# Patient Record
Sex: Female | Born: 1980 | Race: White | Hispanic: No | Marital: Single | Smoking: Former smoker
Health system: Southern US, Community
[De-identification: ages and names within clinical notes are randomized; demographics above are authoritative.]

## PROBLEM LIST (undated history)

## (undated) DIAGNOSIS — K219 Gastro-esophageal reflux disease without esophagitis: Secondary | ICD-10-CM

## (undated) DIAGNOSIS — R519 Headache, unspecified: Secondary | ICD-10-CM

---

## 2007-01-30 HISTORY — PX: DILATION AND CURETTAGE OF UTERUS: SHX78

## 2016-03-24 ENCOUNTER — Emergency Department (HOSPITAL_COMMUNITY)
Admission: EM | Admit: 2016-03-24 | Discharge: 2016-03-24 | Disposition: A | Discharge status: Home / Self Care | Payer: BLUE CROSS/BLUE SHIELD | Attending: Emergency Medicine | Admitting: Emergency Medicine

## 2016-03-24 ENCOUNTER — Emergency Department (HOSPITAL_COMMUNITY): Payer: BLUE CROSS/BLUE SHIELD

## 2016-03-24 ENCOUNTER — Encounter (HOSPITAL_COMMUNITY): Payer: Self-pay | Admitting: Emergency Medicine

## 2016-03-24 DIAGNOSIS — Z79899 Other long term (current) drug therapy: Secondary | ICD-10-CM | POA: Diagnosis not present

## 2016-03-24 DIAGNOSIS — F17200 Nicotine dependence, unspecified, uncomplicated: Secondary | ICD-10-CM | POA: Diagnosis not present

## 2016-03-24 DIAGNOSIS — K920 Hematemesis: Secondary | ICD-10-CM

## 2016-03-24 DIAGNOSIS — D72829 Elevated white blood cell count, unspecified: Secondary | ICD-10-CM | POA: Insufficient documentation

## 2016-03-24 DIAGNOSIS — R112 Nausea with vomiting, unspecified: Secondary | ICD-10-CM

## 2016-03-24 DIAGNOSIS — E860 Dehydration: Secondary | ICD-10-CM | POA: Insufficient documentation

## 2016-03-24 LAB — COMPREHENSIVE METABOLIC PANEL
ALT: 21 U/L (ref 14–54)
ANION GAP: 16 — AB (ref 5–15)
AST: 30 U/L (ref 15–41)
Albumin: 4.5 g/dL (ref 3.5–5.0)
Alkaline Phosphatase: 62 U/L (ref 38–126)
BUN: 10 mg/dL (ref 6–20)
CHLORIDE: 100 mmol/L — AB (ref 101–111)
CO2: 20 mmol/L — AB (ref 22–32)
Calcium: 9.4 mg/dL (ref 8.9–10.3)
Creatinine, Ser: 1.18 mg/dL — ABNORMAL HIGH (ref 0.44–1.00)
GFR, EST NON AFRICAN AMERICAN: 59 mL/min — AB (ref >60)
Glucose, Bld: 171 mg/dL — ABNORMAL HIGH (ref 65–99)
POTASSIUM: 3.7 mmol/L (ref 3.5–5.1)
SODIUM: 136 mmol/L (ref 135–145)
Total Bilirubin: 1.5 mg/dL — ABNORMAL HIGH (ref 0.3–1.2)
Total Protein: 8.4 g/dL — ABNORMAL HIGH (ref 6.5–8.1)

## 2016-03-24 LAB — CBC WITH DIFFERENTIAL/PLATELET
Basophils Absolute: 0 10*3/uL (ref 0.0–0.1)
Basophils Relative: 0 %
EOS ABS: 0 10*3/uL (ref 0.0–0.7)
Eosinophils Relative: 0 %
HEMATOCRIT: 40.3 % (ref 36.0–46.0)
HEMOGLOBIN: 14.5 g/dL (ref 12.0–15.0)
LYMPHS ABS: 2.8 10*3/uL (ref 0.7–4.0)
LYMPHS PCT: 11 %
MCH: 32.8 pg (ref 26.0–34.0)
MCHC: 36 g/dL (ref 30.0–36.0)
MCV: 91.2 fL (ref 78.0–100.0)
MONOS PCT: 5 %
Monocytes Absolute: 1.4 10*3/uL — ABNORMAL HIGH (ref 0.1–1.0)
NEUTROS ABS: 20.8 10*3/uL — AB (ref 1.7–7.7)
NEUTROS PCT: 84 %
Platelets: 312 10*3/uL (ref 150–400)
RBC: 4.42 MIL/uL (ref 3.87–5.11)
RDW: 12.2 % (ref 11.5–15.5)
WBC: 24.9 10*3/uL — AB (ref 4.0–10.5)

## 2016-03-24 LAB — I-STAT BETA HCG BLOOD, ED (MC, WL, AP ONLY): I-stat hCG, quantitative: <5 m[IU]/mL (ref <5)

## 2016-03-24 LAB — LIPASE, BLOOD: LIPASE: 16 U/L (ref 11–51)

## 2016-03-24 MED ORDER — PROMETHAZINE HCL 25 MG RE SUPP: 25.0000 mg | Freq: Four times a day (QID) | RECTAL | 0 refills | Status: DC | PRN

## 2016-03-24 MED ORDER — GI COCKTAIL ~~LOC~~
30.0000 mL | Freq: Once | ORAL | Status: AC
  Administered 2016-03-24: 30 mL via ORAL
  Filled 2016-03-24: qty 30

## 2016-03-24 MED ORDER — PROMETHAZINE HCL 25 MG/ML IJ SOLN
25.0000 mg | Freq: Once | INTRAMUSCULAR | Status: AC
  Administered 2016-03-24: 25 mg via INTRAVENOUS
  Filled 2016-03-24: qty 1

## 2016-03-24 MED ORDER — KETOROLAC TROMETHAMINE 30 MG/ML IJ SOLN
30.0000 mg | Freq: Once | INTRAMUSCULAR | Status: AC
  Administered 2016-03-24: 30 mg via INTRAVENOUS
  Filled 2016-03-24: qty 1

## 2016-03-24 MED ORDER — PROMETHAZINE HCL 25 MG PO TABS: 25.0000 mg | ORAL_TABLET | Freq: Four times a day (QID) | ORAL | 0 refills | Status: DC | PRN

## 2016-03-24 MED ORDER — SODIUM CHLORIDE 0.9 % IV BOLUS (SEPSIS)
1000.0000 mL | Freq: Once | INTRAVENOUS | Status: AC
  Administered 2016-03-24: 1000 mL via INTRAVENOUS

## 2016-03-24 MED ORDER — MORPHINE SULFATE (PF) 4 MG/ML IV SOLN
4.0000 mg | Freq: Once | INTRAVENOUS | Status: AC
  Administered 2016-03-24: 4 mg via INTRAVENOUS
  Filled 2016-03-24: qty 1

## 2016-03-24 MED ORDER — ONDANSETRON HCL 4 MG/2ML IJ SOLN
4.0000 mg | Freq: Once | INTRAMUSCULAR | Status: AC
  Administered 2016-03-24: 4 mg via INTRAVENOUS
  Filled 2016-03-24: qty 2

## 2016-03-24 MED ORDER — LIDOCAINE VISCOUS 2 % MT SOLN: 20.0000 mL | OROMUCOSAL | 0 refills | Status: DC | PRN

## 2016-03-24 MED ORDER — PANTOPRAZOLE SODIUM 40 MG IV SOLR
40.0000 mg | Freq: Once | INTRAVENOUS | Status: AC
  Administered 2016-03-24: 40 mg via INTRAVENOUS
  Filled 2016-03-24: qty 40
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
## 2016-03-24 NOTE — ED Provider Notes (Signed)
[facility]-EMERGENCY DEPT Provider Note   CSN: [id number] Arrival date & time: 03/24/16  1135     History   Chief Complaint Chief Complaint  Patient presents with  . Emesis    HPI Joanna Massey is a 36 y.o. female.  HPI   Pt p/w uncontrolled nausea and vomiting x 3 days, now with throat irritation and bloody streaks in the emesis.  Vomiting TNTC, diarrhea only 3 times.  Notes initially had epigastric pain that is now just mild soreness.  Denies urinary or vaginal symptoms.  Denies any sick contacts, abnormal foods, recent travel.  No hx abdominal surgeries.  Went to [facility] yesterday and was given zofran without improvement.     History reviewed. No pertinent past medical history.  There are no active problems to display for this patient.   History reviewed. No pertinent surgical history.  OB History    No data available       Home Medications    Prior to Admission medications   Medication Sig Start Date End Date Taking? Authorizing Provider  aspirin-acetaminophen-caffeine (EXCEDRIN MIGRAINE) [phone] MG tablet Take 1 tablet by mouth every 6 (six) hours as needed for headache.   Yes Historical Provider, MD  lidocaine (XYLOCAINE) 2 % solution Use as directed 20 mLs in the mouth or throat as needed for mouth pain. 03/24/16   Trixie Dredge, PA-C  promethazine (PHENERGAN) 25 MG suppository Place 1 suppository (25 mg total) rectally every 6 (six) hours as needed for nausea, vomiting or refractory nausea / vomiting. 03/24/16   Trixie Dredge, PA-C  promethazine (PHENERGAN) 25 MG tablet Take 1 tablet (25 mg total) by mouth every 6 (six) hours as needed for nausea or vomiting. 03/24/16   Trixie Dredge, PA-C    Family History No family history on file.  Social History Social History  Substance Use Topics  . Smoking status: Current Every Day Smoker  . Smokeless tobacco: Not on file  . Alcohol use No     Allergies   Patient has no known allergies.   Review of Systems Review of  Systems  All other systems reviewed and are negative.    Physical Exam Updated Vital Signs BP 119/77 (BP Location: Right Arm)   Pulse 68   Temp 99.2 F (37.3 C) (Oral)   Resp 18   LMP 03/03/2016   SpO2 100%   Physical Exam  Constitutional: She appears well-developed and well-nourished. No distress.  HENT:  Head: Normocephalic and atraumatic.  Neck: Normal range of motion. Neck supple.  Erythematous irritation of the posterior pharynx  Cardiovascular: Normal rate and regular rhythm.   Pulmonary/Chest: Effort normal and breath sounds normal. No stridor. No respiratory distress. She has no wheezes. She has no rales.  Abdominal: Soft. She exhibits no distension. There is no tenderness. There is no rebound and no guarding.  Lymphadenopathy:    She has no cervical adenopathy.  Neurological: She is alert.  Skin: She is not diaphoretic.  Nursing note and vitals reviewed.    ED Treatments / Results  Labs (all labs ordered are listed, but only abnormal results are displayed) Labs Reviewed  COMPREHENSIVE METABOLIC PANEL - Abnormal; Notable for the following:       Result Value   Chloride 100 (*)    CO2 20 (*)    Glucose, Bld 171 (*)    Creatinine, Ser 1.18 (*)    Total Protein 8.4 (*)    Total Bilirubin 1.5 (*)    GFR calc non Af  Amer 59 (*)    Anion gap 16 (*)    All other components within normal limits  CBC WITH DIFFERENTIAL/PLATELET - Abnormal; Notable for the following:    WBC 24.9 (*)    Neutro Abs 20.8 (*)    Monocytes Absolute 1.4 (*)    All other components within normal limits  LIPASE, BLOOD  I-STAT BETA HCG BLOOD, ED ([facility], [facility], [facility] ONLY)    EKG  EKG Interpretation None       Radiology Dg Neck Soft Tissue  Result Date: 03/24/2016 CLINICAL DATA:  Sore throat and hematemesis. EXAM: NECK SOFT TISSUES - 1+ VIEW COMPARISON:  None. FINDINGS: There is no evidence of retropharyngeal soft tissue swelling or epiglottic enlargement. The cervical airway is  unremarkable and no radio-opaque foreign body identified. There is mild nonspecific prominence of the prevertebral soft tissues at and below the C3-4 level. IMPRESSION: Mild nonspecific soft tissue prominence of the prevertebral soft tissues at and below the C3-4 level. Patent airway. Electronically Signed   By: Elberta Fortisaniel  Boyle M.D.   On: 03/24/2016 14:02   Dg Chest 2 View  Result Date: 03/24/2016 CLINICAL DATA:  Cough and hemoptysis for 2 days. EXAM: CHEST  2 VIEW COMPARISON:  None. FINDINGS: The heart size and mediastinal contours are within normal limits. Both lungs are clear. The visualized skeletal structures are unremarkable. IMPRESSION: No active cardiopulmonary disease. Electronically Signed   By: Myles RosenthalJohn  Stahl M.D.   On: 03/24/2016 13:36    Procedures Procedures (including critical care time)  Medications Ordered in ED Medications  sodium chloride 0.9 % bolus 1,000 mL (0 mLs Intravenous Stopped 03/24/16 1408)  promethazine (PHENERGAN) injection 25 mg (25 mg Intravenous Given 03/24/16 1240)  gi cocktail (Maalox,Lidocaine,Donnatal) (30 mLs Oral Given 03/24/16 1156)  ketorolac (TORADOL) 30 MG/ML injection 30 mg (30 mg Intravenous Given 03/24/16 1156)  ondansetron (ZOFRAN) injection 4 mg (4 mg Intravenous Given 03/24/16 1156)  pantoprazole (PROTONIX) injection 40 mg (40 mg Intravenous Given 03/24/16 1156)  morphine 4 MG/ML injection 4 mg (4 mg Intravenous Given 03/24/16 1327)  ondansetron (ZOFRAN) injection 4 mg (4 mg Intravenous Given 03/24/16 1458)     Initial Impression / Assessment and Plan / ED Course  I have reviewed the triage vital signs and the nursing notes.  Pertinent labs & imaging results that were available during my care of the patient were reviewed by me and considered in my medical decision making (see chart for details).  Clinical Course as of Mar 24 1537  Sat Mar 24, 2016  1536 Repeat abdominal completely benign.  No tenderness.  Pt tolerating PO.  Discussed strict return  precautions.    [EW]    Clinical Course User Index [EW] Trixie DredgeEmily Courtni Balash, PA-C    Afebrile, nontoxic patient with several days of N/V.  Last night develop throat pain and blood-streak emesis.  Likely mallory-weiss tear.  Vomiting controlled.   Some renal insufficiency, likely from dehydration.  IVF given.  Pt tolerating PO.  No abdominal pain or tenderness.  Xrays of chest and neck negative.  Labs significant also for leukocytosis.  No e/o bacterial infection apparent from my exam.  Also seen and examined by Dr Particia NearingHaviland.   D/C home with phenergan (PO and PR, discussed with patient), viscous lidocaine for throat pain, PCP follow up for repeat labs and recheck.  Discussed result, findings, treatment, and follow up  with patient.  Pt given return precautions.  Pt verbalizes understanding and agrees with plan.  Final Clinical Impressions(s) / ED Diagnoses   Final diagnoses:  Non-intractable vomiting with nausea, unspecified vomiting type  Hematemesis with nausea  Leukocytosis, unspecified type  Dehydration    New Prescriptions New Prescriptions   LIDOCAINE (XYLOCAINE) 2 % SOLUTION    Use as directed 20 mLs in the mouth or throat as needed for mouth pain.   PROMETHAZINE (PHENERGAN) 25 MG SUPPOSITORY    Place 1 suppository (25 mg total) rectally every 6 (six) hours as needed for nausea, vomiting or refractory nausea / vomiting.   PROMETHAZINE (PHENERGAN) 25 MG TABLET    Take 1 tablet (25 mg total) by mouth every 6 (six) hours as needed for nausea or vomiting.     Trixie Dredge, PA-C 03/24/16 1539    Jacalyn Lefevre, MD 03/25/16 [phone]

## 2016-03-24 NOTE — Discharge Instructions (Signed)
Read the information below.  Use the prescribed medication as directed.  Please discuss all new medications with your pharmacist.  You may return to the Emergency Department at any time for worsening condition or any new symptoms that concern you.   If you develop fevers, uncontrolled pain, uncontrolled vomiting, or difficulty breathing or swallowing return to the ER for a recheck.

## 2016-03-24 NOTE — ED Triage Notes (Signed)
Per pt, states she has been vomiting since last Thursday-states she went to [facility] yesterday and was given Zofran-states now hands are numb

## 2016-03-24 NOTE — ED Notes (Signed)
Pt denies nausea and vomiting after PO challenge.

## 2016-03-25 ENCOUNTER — Encounter (HOSPITAL_COMMUNITY): Payer: Self-pay

## 2016-03-25 ENCOUNTER — Emergency Department (HOSPITAL_COMMUNITY): Payer: BLUE CROSS/BLUE SHIELD

## 2016-03-25 ENCOUNTER — Emergency Department (HOSPITAL_COMMUNITY)
Admission: EM | Admit: 2016-03-25 | Discharge: 2016-03-25 | Disposition: A | Discharge status: Home / Self Care | Payer: BLUE CROSS/BLUE SHIELD | Attending: Emergency Medicine | Admitting: Emergency Medicine

## 2016-03-25 DIAGNOSIS — F17200 Nicotine dependence, unspecified, uncomplicated: Secondary | ICD-10-CM | POA: Insufficient documentation

## 2016-03-25 DIAGNOSIS — Z7982 Long term (current) use of aspirin: Secondary | ICD-10-CM | POA: Insufficient documentation

## 2016-03-25 DIAGNOSIS — R918 Other nonspecific abnormal finding of lung field: Secondary | ICD-10-CM | POA: Diagnosis not present

## 2016-03-25 DIAGNOSIS — J029 Acute pharyngitis, unspecified: Secondary | ICD-10-CM | POA: Diagnosis present

## 2016-03-25 DIAGNOSIS — Z79899 Other long term (current) drug therapy: Secondary | ICD-10-CM | POA: Diagnosis not present

## 2016-03-25 DIAGNOSIS — R112 Nausea with vomiting, unspecified: Secondary | ICD-10-CM | POA: Diagnosis not present

## 2016-03-25 LAB — CBC WITH DIFFERENTIAL/PLATELET
Basophils Absolute: 0 10*3/uL (ref 0.0–0.1)
Basophils Relative: 0 %
EOS PCT: 0 %
Eosinophils Absolute: 0 10*3/uL (ref 0.0–0.7)
HEMATOCRIT: 39.6 % (ref 36.0–46.0)
HEMOGLOBIN: 13.8 g/dL (ref 12.0–15.0)
LYMPHS ABS: 2.8 10*3/uL (ref 0.7–4.0)
LYMPHS PCT: 22 %
MCH: 32.6 pg (ref 26.0–34.0)
MCHC: 34.8 g/dL (ref 30.0–36.0)
MCV: 93.6 fL (ref 78.0–100.0)
Monocytes Absolute: 1.1 10*3/uL — ABNORMAL HIGH (ref 0.1–1.0)
Monocytes Relative: 9 %
Neutro Abs: 8.7 10*3/uL — ABNORMAL HIGH (ref 1.7–7.7)
Neutrophils Relative %: 69 %
Platelets: 236 10*3/uL (ref 150–400)
RBC: 4.23 MIL/uL (ref 3.87–5.11)
RDW: 12.6 % (ref 11.5–15.5)
WBC: 12.5 10*3/uL — AB (ref 4.0–10.5)

## 2016-03-25 LAB — LIPASE, BLOOD: LIPASE: 17 U/L (ref 11–51)

## 2016-03-25 LAB — RAPID STREP SCREEN (MED CTR MEBANE ONLY): STREPTOCOCCUS, GROUP A SCREEN (DIRECT): NEGATIVE

## 2016-03-25 MED ORDER — IOPAMIDOL (ISOVUE-300) INJECTION 61%
75.0000 mL | Freq: Once | INTRAVENOUS | Status: AC | PRN
  Administered 2016-03-25: 75 mL via INTRAVENOUS

## 2016-03-25 MED ORDER — SODIUM CHLORIDE 0.9 % IV BOLUS (SEPSIS)
1000.0000 mL | Freq: Once | INTRAVENOUS | Status: AC
  Administered 2016-03-25: 1000 mL via INTRAVENOUS

## 2016-03-25 MED ORDER — HYDROCODONE-ACETAMINOPHEN 7.5-325 MG/15ML PO SOLN: 10.0000 mL | Freq: Four times a day (QID) | ORAL | 0 refills | Status: DC | PRN

## 2016-03-25 MED ORDER — PANTOPRAZOLE SODIUM 40 MG IV SOLR
40.0000 mg | Freq: Once | INTRAVENOUS | Status: AC
  Administered 2016-03-25: 40 mg via INTRAVENOUS
  Filled 2016-03-25: qty 40

## 2016-03-25 MED ORDER — PENICILLIN G BENZATHINE 1200000 UNIT/2ML IM SUSP
1.2000 10*6.[IU] | Freq: Once | INTRAMUSCULAR | Status: AC
  Administered 2016-03-25: 1.2 10*6.[IU] via INTRAMUSCULAR
  Filled 2016-03-25: qty 2

## 2016-03-25 MED ORDER — POTASSIUM CHLORIDE 10 MEQ/100ML IV SOLN
10.0000 meq | INTRAVENOUS | Status: AC
  Administered 2016-03-25 (×3): 10 meq via INTRAVENOUS
  Filled 2016-03-25 (×3): qty 100

## 2016-03-25 MED ORDER — PENICILLIN G BENZATHINE & PROC 1200000 UNIT/2ML IM SUSP
1.2000 10*6.[IU] | Freq: Once | INTRAMUSCULAR | Status: DC
  Filled 2016-03-25: qty 2

## 2016-03-25 MED ORDER — MORPHINE SULFATE (PF) 4 MG/ML IV SOLN
4.0000 mg | Freq: Once | INTRAVENOUS | Status: AC
  Administered 2016-03-25: 4 mg via INTRAVENOUS
  Filled 2016-03-25: qty 1

## 2016-03-25 MED ORDER — IOPAMIDOL (ISOVUE-300) INJECTION 61%
INTRAVENOUS | Status: AC
  Filled 2016-03-25: qty 75

## 2016-03-25 MED ORDER — METOCLOPRAMIDE HCL 5 MG/ML IJ SOLN
10.0000 mg | Freq: Once | INTRAMUSCULAR | Status: AC
  Administered 2016-03-25: 10 mg via INTRAVENOUS
  Filled 2016-03-25: qty 2

## 2016-03-25 MED ORDER — HYDROMORPHONE HCL 1 MG/ML IJ SOLN
1.0000 mg | Freq: Once | INTRAMUSCULAR | Status: AC
  Administered 2016-03-25: 1 mg via INTRAVENOUS
  Filled 2016-03-25: qty 1

## 2016-03-25 MED ORDER — SODIUM CHLORIDE 0.9 % IV SOLN
30.0000 meq | Freq: Once | INTRAVENOUS | Status: DC
  Filled 2016-03-25: qty 15

## 2016-03-25 NOTE — Discharge Instructions (Signed)
Read the information below.  Use the prescribed medication as directed.  Please discuss all new medications with your pharmacist.  You may return to the Emergency Department at any time for worsening condition or any new symptoms that concern you.   If you develop high fevers, difficulty swallowing or breathing, or you are unable to tolerate fluids by mouth, return to the ER immediately for a recheck.    °

## 2016-03-25 NOTE — ED Notes (Signed)
Pt ambulatory and independent at discharge.  Verbalized understanding of discharge instructions 

## 2016-03-25 NOTE — ED Triage Notes (Signed)
Pt here yesterday for vomiting.  Pt told to return if continues.  States even with phenergan suppository she continues to have emesis.  Some blood noted.  No abdominal pain

## 2016-03-25 NOTE — ED Notes (Signed)
Patient transported to CT 

## 2016-03-25 NOTE — ED Notes (Signed)
Waiting for KCl from pharmacy. Pt waiting for CT.

## 2016-03-25 NOTE — ED Provider Notes (Signed)
[facility]-EMERGENCY DEPT Provider Note   CSN: [id number] Arrival date & time: 03/25/16  4098     History   Chief Complaint Chief Complaint  Patient presents with  . Emesis  . Sore Throat    HPI Joanna Massey is a 36 y.o. female.  HPI   Pt seen in the ED by me yesterday returns for continued symptoms.  Was seen yesterday for 3 days of N/V/D with more vomiting than diarrhea with abrupt throat pain and blood-tinged emesis.  She is also coughing.  Symptoms improved in ED though concerning leukocytosis, suspected dx mallory weiss tear.  Returns today for continued pain in her throat and N/V, also having more cough that feels more productive.  Does have some pain in her chest.  Has had a few episodes of diarrhea.  Denies SOB, abdominal pain.     History reviewed. No pertinent past medical history.  There are no active problems to display for this patient.   History reviewed. No pertinent surgical history.  OB History    No data available       Home Medications    Prior to Admission medications   Medication Sig Start Date End Date Taking? Authorizing Provider  aspirin-acetaminophen-caffeine (EXCEDRIN MIGRAINE) [phone] MG tablet Take 1 tablet by mouth every 6 (six) hours as needed for headache.   Yes Historical Provider, MD  lidocaine (XYLOCAINE) 2 % solution Use as directed 20 mLs in the mouth or throat as needed for mouth pain. 03/24/16  Yes Trixie Dredge, PA-C  promethazine (PHENERGAN) 25 MG suppository Place 1 suppository (25 mg total) rectally every 6 (six) hours as needed for nausea, vomiting or refractory nausea / vomiting. 03/24/16  Yes Trixie Dredge, PA-C  promethazine (PHENERGAN) 25 MG tablet Take 1 tablet (25 mg total) by mouth every 6 (six) hours as needed for nausea or vomiting. 03/24/16  Yes Trixie Dredge, PA-C  HYDROcodone-acetaminophen (HYCET) 7.5-325 mg/15 ml solution Take 10 mLs by mouth 4 (four) times daily as needed for moderate pain or severe pain. 03/25/16   Trixie Dredge,  PA-C    Family History History reviewed. No pertinent family history.  Social History Social History  Substance Use Topics  . Smoking status: Current Every Day Smoker  . Smokeless tobacco: Never Used  . Alcohol use No     Allergies   Patient has no known allergies.   Review of Systems Review of Systems  All other systems reviewed and are negative.    Physical Exam Updated Vital Signs BP 134/85 (BP Location: Left Arm)   Pulse (!) 58   Temp 98.4 F (36.9 C) (Oral)   Resp 16   Ht 5' 3.5" (1.613 m)   Wt 110.2 kg   LMP 03/03/2016   SpO2 98%   BMI 42.37 kg/m   Physical Exam  Constitutional: She appears well-developed and well-nourished. No distress.  HENT:  Head: Normocephalic and atraumatic.  Mouth/Throat: Uvula is midline. Mucous membranes are not dry. Oropharyngeal exudate, posterior oropharyngeal edema and posterior oropharyngeal erythema present. No tonsillar abscesses.  Neck: Normal range of motion. Neck supple.  Cardiovascular: Normal rate and regular rhythm.   Pulmonary/Chest: Effort normal and breath sounds normal. No respiratory distress. She has no wheezes. She has no rales.  Abdominal: Soft. She exhibits no distension. There is no tenderness. There is no rebound and no guarding.  Lymphadenopathy:    She has no cervical adenopathy.  Neurological: She is alert.  Skin: She is not diaphoretic.  Nursing note and vitals  reviewed.    ED Treatments / Results  Labs (all labs ordered are listed, but only abnormal results are displayed) Labs Reviewed  CBC WITH DIFFERENTIAL/PLATELET - Abnormal; Notable for the following:       Result Value   WBC 12.5 (*)    Neutro Abs 8.7 (*)    Monocytes Absolute 1.1 (*)    All other components within normal limits  COMPREHENSIVE METABOLIC PANEL - Abnormal; Notable for the following:    Potassium 2.7 (*)    Glucose, Bld 101 (*)    Creatinine, Ser 1.09 (*)    All other components within normal limits  RAPID STREP  SCREEN (NOT [facility])  CULTURE, GROUP A STREP ([facility])  LIPASE, BLOOD    EKG  EKG Interpretation None       Radiology Dg Neck Soft Tissue  Result Date: 03/24/2016 CLINICAL DATA:  Sore throat and hematemesis. EXAM: NECK SOFT TISSUES - 1+ VIEW COMPARISON:  None. FINDINGS: There is no evidence of retropharyngeal soft tissue swelling or epiglottic enlargement. The cervical airway is unremarkable and no radio-opaque foreign body identified. There is mild nonspecific prominence of the prevertebral soft tissues at and below the C3-4 level. IMPRESSION: Mild nonspecific soft tissue prominence of the prevertebral soft tissues at and below the C3-4 level. Patent airway. Electronically Signed   By: Elberta Fortisaniel  Boyle M.D.   On: 03/24/2016 14:02   Dg Chest 2 View  Result Date: 03/24/2016 CLINICAL DATA:  Cough and hemoptysis for 2 days. EXAM: CHEST  2 VIEW COMPARISON:  None. FINDINGS: The heart size and mediastinal contours are within normal limits. Both lungs are clear. The visualized skeletal structures are unremarkable. IMPRESSION: No active cardiopulmonary disease. Electronically Signed   By: Myles RosenthalJohn  Stahl M.D.   On: 03/24/2016 13:36   Ct Soft Tissue Neck W Contrast  Result Date: 03/25/2016 CLINICAL DATA:  Persistent emesis.  Hemoptysis.  Sore throat. EXAM: CT NECK WITH CONTRAST TECHNIQUE: Multidetector CT imaging of the neck was performed using the standard protocol following the bolus administration of intravenous contrast. CONTRAST:  75 mL Isovue 300 COMPARISON:  Soft tissue neck radiographs 03/24/2016 FINDINGS: Pharynx and larynx: The palate tonsils are enlarged bilaterally. Adenoid tissue is prominent. Lingual tonsils are enlarged as well. There is no focal mass lesion. The parapharyngeal fat is clear. The airway is patent. The hypopharynx is clear. Vocal cords are midline and symmetric. Salivary glands: Mild inflammatory changes are seen about bilaterally. There is no obstruction of the submandibular duct.  Parotid glands are within normal limits. Thyroid: Normal. Lymph nodes: Enlarged level 2 and 3 lymph nodes bilaterally are likely reactive. Subcentimeter level 1 lymph nodes are present as well. Vascular: Minimal atherosclerotic calcifications are stenosis. Limited intracranial: Within normal limits. Visualized orbits: Unremarkable Mastoids and visualized paranasal sinuses: Polyps or mucous retention cysts are present in the left maxillary sinus. There is some mucosal thickening and posterior right ethmoid air cells. The remaining paranasal sinuses and mastoid air cells are clear. Skeleton: Vertebral body heights and alignment are maintained. No focal lytic or blastic lesions are present. Upper chest: The lung apices are clear. 1. Diffuse enlarged of palatine tonsils, adenoid tissue common lingual tonsils compatible with acute pharyngitis. 2. No discrete mass or abscess formation. 3. Bilateral cervical adenopathy is likely reactive. Electronically Signed   By: Marin Robertshristopher  Mattern M.D.   On: 03/25/2016 11:45   Ct Chest W Contrast  Result Date: 03/25/2016 CLINICAL DATA:  Continuing emesis. Seen here yesterday for the same. Hemoptysis reported, sore  throat. EXAM: CT CHEST WITH CONTRAST TECHNIQUE: Multidetector CT imaging of the chest was performed during intravenous contrast administration. CONTRAST:  75mL ISOVUE-300 IOPAMIDOL (ISOVUE-300) INJECTION 61% COMPARISON:  Chest radiograph, 03/24/2016 FINDINGS: Cardiovascular: No significant vascular findings. Normal heart size. No pericardial effusion. The great vessels are normal in caliber. No aortic atherosclerosis. No coronary artery calcifications. Mediastinum/Nodes: No enlarged mediastinal, hilar, or axillary lymph nodes. Thyroid gland, trachea, and esophagus demonstrate no significant findings. Lungs/Pleura: Lungs are clear. No pleural effusion or pneumothorax. Upper Abdomen: No acute findings.  Gallstones. Musculoskeletal: No chest wall abnormality. No acute or  significant osseous findings. IMPRESSION: 1. No acute findings. No findings to account for hemoptysis. Lungs are clear. 2. Gallstones. Electronically Signed   By: Amie Portland M.D.   On: 03/25/2016 11:51    Procedures Procedures (including critical care time)  Medications Ordered in ED Medications  potassium chloride 10 mEq in 100 mL IVPB (10 mEq Intravenous New Bag/Given 03/25/16 1455)  iopamidol (ISOVUE-300) 61 % injection (not administered)  sodium chloride 0.9 % bolus 1,000 mL (0 mLs Intravenous Stopped 03/25/16 1035)  metoCLOPramide (REGLAN) injection 10 mg (10 mg Intravenous Given 03/25/16 0937)  pantoprazole (PROTONIX) injection 40 mg (40 mg Intravenous Given 03/25/16 0941)  morphine 4 MG/ML injection 4 mg (4 mg Intravenous Given 03/25/16 0938)  iopamidol (ISOVUE-300) 61 % injection 75 mL (75 mLs Intravenous Contrast Given 03/25/16 1108)  metoCLOPramide (REGLAN) injection 10 mg (10 mg Intravenous Given 03/25/16 1247)  sodium chloride 0.9 % bolus 1,000 mL (0 mLs Intravenous Stopped 03/25/16 1456)  HYDROmorphone (DILAUDID) injection 1 mg (1 mg Intravenous Given 03/25/16 1247)  penicillin g benzathine (BICILLIN LA) 1200000 UNIT/2ML injection 1.2 Million Units (1.2 Million Units Intramuscular Given 03/25/16 1456)     Initial Impression / Assessment and Plan / ED Course  I have reviewed the triage vital signs and the nursing notes.  Pertinent labs & imaging results that were available during my care of the patient were reviewed by me and considered in my medical decision making (see chart for details).  Clinical Course as of Mar 25 1530  Wynelle Link Mar 25, 2016  1445 Pt tolerating PO.   [EW]    Clinical Course User Index [EW] Trixie Dredge, PA-C    Afebrile, nontoxic patient with N/V, sore throat.  Leukocytosis has improved today.  Now hypokalemic, repleted in the ED.  IVF, symptomatic medications given . CT neck and chest reveals only pharyngitis with associated lymphadenopathy.  Strep screen is  negative but given significant symptoms and appearance of pharynx will treat.  IM penicillin given.  Pt tolerating PO including apple sauce prior to discharge.   D/C home with lortab elixir.  Pt previously given phenergan PO and PR.  Discussed result, findings, treatment, and follow up  with patient.  Pt given return precautions.  Pt verbalizes understanding and agrees with plan.       Final Clinical Impressions(s) / ED Diagnoses   Final diagnoses:  Pharyngitis, unspecified etiology  Nausea and vomiting, intractability of vomiting not specified, unspecified vomiting type    New Prescriptions New Prescriptions   HYDROCODONE-ACETAMINOPHEN (HYCET) 7.5-325 MG/15 ML SOLUTION    Take 10 mLs by mouth 4 (four) times daily as needed for moderate pain or severe pain.     Trixie Dredge, PA-C 03/25/16 1532    Rolland Porter, MD 04/06/16 [phone]

## 2016-03-26 LAB — COMPREHENSIVE METABOLIC PANEL
ALK PHOS: 51 U/L (ref 38–126)
ALT: 15 U/L (ref 14–54)
AST: 18 U/L (ref 15–41)
Albumin: 4.1 g/dL (ref 3.5–5.0)
Anion gap: 10 (ref 5–15)
BILIRUBIN TOTAL: 1.1 mg/dL (ref 0.3–1.2)
BUN: 11 mg/dL (ref 6–20)
CALCIUM: 8.9 mg/dL (ref 8.9–10.3)
CO2: 26 mmol/L (ref 22–32)
CREATININE: 1.09 mg/dL — AB (ref 0.44–1.00)
Chloride: 103 mmol/L (ref 101–111)
Glucose, Bld: 101 mg/dL — ABNORMAL HIGH (ref 65–99)
Potassium: 2.7 mmol/L — CL (ref 3.5–5.1)
Sodium: 139 mmol/L (ref 135–145)
Total Protein: 7.4 g/dL (ref 6.5–8.1)

## 2016-03-28 LAB — CULTURE, GROUP A STREP (THRC)

## 2017-01-29 HISTORY — PX: CHOLECYSTECTOMY: SHX55

## 2017-06-18 ENCOUNTER — Encounter (HOSPITAL_BASED_OUTPATIENT_CLINIC_OR_DEPARTMENT_OTHER): Payer: Self-pay | Admitting: *Deleted

## 2017-06-18 ENCOUNTER — Ambulatory Visit: Payer: BLUE CROSS/BLUE SHIELD | Admitting: Urgent Care

## 2017-06-18 ENCOUNTER — Emergency Department (HOSPITAL_BASED_OUTPATIENT_CLINIC_OR_DEPARTMENT_OTHER): Payer: BLUE CROSS/BLUE SHIELD

## 2017-06-18 ENCOUNTER — Emergency Department (HOSPITAL_BASED_OUTPATIENT_CLINIC_OR_DEPARTMENT_OTHER)
Admission: EM | Admit: 2017-06-18 | Discharge: 2017-06-18 | Disposition: A | Discharge status: Home / Self Care | Payer: BLUE CROSS/BLUE SHIELD | Attending: Emergency Medicine | Admitting: Emergency Medicine

## 2017-06-18 ENCOUNTER — Other Ambulatory Visit: Payer: Self-pay

## 2017-06-18 ENCOUNTER — Encounter: Payer: Self-pay | Admitting: Urgent Care

## 2017-06-18 VITALS — HR 73 | Temp 98.2°F | Resp 16 | Ht 63.5 in | Wt 233.8 lb

## 2017-06-18 DIAGNOSIS — F17200 Nicotine dependence, unspecified, uncomplicated: Secondary | ICD-10-CM | POA: Diagnosis not present

## 2017-06-18 DIAGNOSIS — R1110 Vomiting, unspecified: Secondary | ICD-10-CM | POA: Diagnosis not present

## 2017-06-18 DIAGNOSIS — R6883 Chills (without fever): Secondary | ICD-10-CM

## 2017-06-18 DIAGNOSIS — K8020 Calculus of gallbladder without cholecystitis without obstruction: Secondary | ICD-10-CM | POA: Diagnosis not present

## 2017-06-18 DIAGNOSIS — F1290 Cannabis use, unspecified, uncomplicated: Secondary | ICD-10-CM | POA: Diagnosis not present

## 2017-06-18 DIAGNOSIS — R1010 Upper abdominal pain, unspecified: Secondary | ICD-10-CM

## 2017-06-18 DIAGNOSIS — E860 Dehydration: Secondary | ICD-10-CM | POA: Diagnosis not present

## 2017-06-18 DIAGNOSIS — Z79899 Other long term (current) drug therapy: Secondary | ICD-10-CM | POA: Insufficient documentation

## 2017-06-18 DIAGNOSIS — R112 Nausea with vomiting, unspecified: Secondary | ICD-10-CM

## 2017-06-18 DIAGNOSIS — R1115 Cyclical vomiting syndrome unrelated to migraine: Secondary | ICD-10-CM

## 2017-06-18 LAB — COMPREHENSIVE METABOLIC PANEL
ALK PHOS: 56 U/L (ref 38–126)
ALT: 11 U/L — AB (ref 14–54)
AST: 22 U/L (ref 15–41)
Albumin: 4.2 g/dL (ref 3.5–5.0)
Anion gap: 13 (ref 5–15)
BILIRUBIN TOTAL: 0.6 mg/dL (ref 0.3–1.2)
BUN: 9 mg/dL (ref 6–20)
CALCIUM: 9 mg/dL (ref 8.9–10.3)
CO2: 21 mmol/L — ABNORMAL LOW (ref 22–32)
CREATININE: 1.07 mg/dL — AB (ref 0.44–1.00)
Chloride: 106 mmol/L (ref 101–111)
Glucose, Bld: 111 mg/dL — ABNORMAL HIGH (ref 65–99)
Potassium: 3.2 mmol/L — ABNORMAL LOW (ref 3.5–5.1)
Sodium: 140 mmol/L (ref 135–145)
Total Protein: 7.5 g/dL (ref 6.5–8.1)

## 2017-06-18 LAB — POCT CBC
GRANULOCYTE PERCENT: 83.8 % — AB (ref 37–80)
HEMATOCRIT: 42.9 % (ref 37.7–47.9)
Hemoglobin: 14.3 g/dL (ref 12.2–16.2)
Lymph, poc: 2.1 (ref 0.6–3.4)
MCH, POC: 31.8 pg — AB (ref 27–31.2)
MCHC: 33.5 g/dL (ref 31.8–35.4)
MCV: 95 fL (ref 80–97)
MID (cbc): 0.2 (ref 0–0.9)
MPV: 9.6 fL (ref 0–99.8)
POC GRANULOCYTE: 11.8 — AB (ref 2–6.9)
POC LYMPH %: 15.1 % (ref 10–50)
POC MID %: 1.1 % (ref 0–12)
Platelet Count, POC: 312 10*3/uL (ref 142–424)
RBC: 4.51 M/uL (ref 4.04–5.48)
RDW, POC: 12.4 %
WBC: 14.1 10*3/uL — AB (ref 4.6–10.2)

## 2017-06-18 LAB — POCT URINALYSIS DIP (MANUAL ENTRY)
GLUCOSE UA: NEGATIVE mg/dL
Leukocytes, UA: NEGATIVE
Nitrite, UA: NEGATIVE
Protein Ur, POC: 100 mg/dL — AB
SPEC GRAV UA: 1.025 (ref 1.010–1.025)
Urobilinogen, UA: 0.2 E.U./dL
pH, UA: 7 (ref 5.0–8.0)

## 2017-06-18 LAB — POCT URINE PREGNANCY: Preg Test, Ur: NEGATIVE

## 2017-06-18 MED ORDER — HALOPERIDOL LACTATE 5 MG/ML IJ SOLN
2.0000 mg | Freq: Once | INTRAMUSCULAR | Status: AC
  Administered 2017-06-18: 2 mg via INTRAVENOUS
  Filled 2017-06-18: qty 1

## 2017-06-18 MED ORDER — ONDANSETRON HCL 4 MG/2ML IJ SOLN
4.0000 mg | Freq: Once | INTRAMUSCULAR | Status: AC
  Administered 2017-06-18: 4 mg via INTRAMUSCULAR

## 2017-06-18 MED ORDER — ONDANSETRON HCL 4 MG/2ML IJ SOLN
4.0000 mg | Freq: Once | INTRAMUSCULAR | Status: AC
  Administered 2017-06-18: 4 mg via INTRAVENOUS
  Filled 2017-06-18: qty 2

## 2017-06-18 MED ORDER — PROCHLORPERAZINE MALEATE 10 MG PO TABS: 10.0000 mg | ORAL_TABLET | Freq: Two times a day (BID) | ORAL | 0 refills | Status: DC | PRN

## 2017-06-18 MED ORDER — ONDANSETRON HCL 4 MG/2ML IJ SOLN: 4.0000 mg | Freq: Once | INTRAMUSCULAR | Status: DC

## 2017-06-18 MED ORDER — PROMETHAZINE HCL 25 MG/ML IJ SOLN: 25.0000 mg | Freq: Once | INTRAMUSCULAR | Status: DC

## 2017-06-18 MED ORDER — IOPAMIDOL (ISOVUE-300) INJECTION 61%
100.0000 mL | Freq: Once | INTRAVENOUS | Status: AC | PRN
  Administered 2017-06-18: 100 mL via INTRAVENOUS

## 2017-06-18 MED ORDER — ONDANSETRON 8 MG PO TBDP: 8.0000 mg | ORAL_TABLET | Freq: Three times a day (TID) | ORAL | 0 refills | Status: DC | PRN

## 2017-06-18 MED ORDER — SODIUM CHLORIDE 0.9 % IV SOLN: Freq: Once | INTRAVENOUS | Status: DC

## 2017-06-18 NOTE — ED Notes (Signed)
Patient standing at the sink vomiting.  MD asked for nausea med.

## 2017-06-18 NOTE — ED Notes (Signed)
Patient presents stating that since Friday has been feeling bad and vomiting.  Stated today she got up and felt like she could go to work but she started vomiting again.  Went to [place] earlier today and was given NS and told she needed to follow up at the ED.  Patient stated she vomited just prior to coming back to the room and now feels alittle better.  Has been asking for ice chips.

## 2017-06-18 NOTE — Discharge Instructions (Addendum)
Your evaluated in the emergency department for nausea and vomiting that has been recurring.  Your lab work did not show an obvious cause of your symptoms but your CAT scan did show that you had gallstones.  This is possibly a cause of your symptoms and will need to be followed up by you with surgery.  We are prescribing some nausea medicine to help control your symptoms.  Please return if any worsening symptoms.

## 2017-06-18 NOTE — ED Notes (Signed)
ED Provider at bedside. 

## 2017-06-18 NOTE — Patient Instructions (Addendum)
I highly recommend that you stop smoking marijuana as this may be the source of your symptoms.  I am checking for a good number of other sources through blood work.  I like we discussed, if you want to make sure that you do not have a stomach infection with Helicobacter pylori, a bacteria that can cause infection of the stomach, then come back to our clinic after you have been off for period of 2 weeks.  Otherwise, you can use Zofran at home once every 8 hours for your nausea and vomiting.  Make sure that you are hydrating aggressively with a mix of water and Gatorade.  If you continue to have nausea and vomiting despite using Zofran and are unable to hold any fluids down then you need to report to the ER as you may have a kidney injury from severe dehydration.   Cannabinoid Hyperemesis Syndrome Cannabinoid hyperemesis syndrome (CHS) is a condition that causes repeated nausea, vomiting, and abdominal pain after long-term (chronic) use of marijuana (cannabis). People with CHS typically use marijuana 3-5 times a day for many years before they have symptoms, although it is possible to develop CHS with as little as 1 use per day. Symptoms of CHS may be mild at first but can get worse and more frequent. In some cases, CHS may cause vomiting many times a day, which can lead to weight loss and dehydration. CHS may go away and come back many times (recur). People may not have symptoms or may otherwise be healthy in between [facility] attacks. What are the causes? The exact cause of this condition is not known. Long-term use of marijuana may over-stimulate certain proteins in the brain that react with chemicals in marijuana (cannabinoid receptors). This over-stimulation may cause CHS. What are the signs or symptoms? Symptoms of this condition are often mild during the first few attacks, but they can get worse over time. Symptoms may include:  Frequent nausea, especially early in the morning.  Vomiting.  Abdominal  pain.  Taking several hot showers throughout the day can also be a sign of this condition. People with CHS may do this because it relieves symptoms. How is this diagnosed? This condition may be diagnosed based on:  Your symptoms and medical history, including any drug use.  A physical exam.  You may have tests done to rule out other problems. These tests may include:  Blood tests.  Urine tests.  Imaging tests, such as an X-ray or CT scan.  How is this treated? Treatment for this condition involves stopping marijuana use. Your health care provider may recommend:  A drug rehabilitation program, if you have trouble stopping marijuana use.  Medicines for nausea.  Hot showers to help relieve symptoms.  Certain creams that contain a substance called capsaicin may improve symptoms when applied to the abdomen. Ask your health care provider before starting any medicines or other treatments. Severe nausea and vomiting may require you to stay at the hospital. You may need IV fluids to prevent or treat dehydration. You may also need certain medicines that must be given at the hospital. Follow these instructions at home: During an attack  Stay in bed and rest in a dark, quiet room.  Take anti-nausea medicine as told by your health care provider.  Try taking hot showers to relieve your symptoms. After an attack  Drink small amounts of clear fluids slowly. Gradually add more.  Once you are able to eat without vomiting, eat soft foods in small amounts  every 3-4 hours. General instructions  Do not use any products that contain marijuana.If you need help quitting, ask your health care provider for resources and treatment options.  Drink enough fluid to keep your urine pale yellow. Avoid drinking fluids that have a lot of sugar or caffeine, such as coffee and soda.  Take and apply over-the-counter and prescription medicines only as told by your health care provider. Ask your health care  provider before starting any new medicines or treatments.  Keep all follow-up visits as told by your health care provider. This is important. Contact a health care provider if:  Your symptoms get worse.  You cannot drink fluids without vomiting.  You have pain and trouble swallowing after an attack. Get help right away if:  You cannot stop vomiting.  You have blood in your vomit or your vomit looks like coffee grounds.  You have severe abdominal pain.  You have stools that are bloody or black, or stools that look like tar.  You have symptoms of dehydration, such as: ? Sunken eyes. ? Inability to make tears. ? Cracked lips. ? Dry mouth. ? Decreased urine production. ? Weakness. ? Sleepiness. ? Fainting. Summary  Cannabinoid hyperemesis syndrome (CHS) is a condition that causes repeated nausea, vomiting, and abdominal pain after long-term use of marijuana.  People with CHS typically use marijuana 3-5 times a day for many years before they have symptoms, although it is possible to develop CHS with as little as 1 use per day.  Treatment for this condition involves stopping marijuana use. Hot showers and capsaicin creams may also help relieve symptoms. Ask your health care provider before starting any medicines or other treatments.  Your health care provider may prescribe medicines to help with nausea.  Get help right away if you have signs of dehydration, such as dry mouth, decreased urine production, or weakness. This information is not intended to replace advice given to you by your health care provider. Make sure you discuss any questions you have with your health care provider. Document Released: 04/25/2016 Document Revised: 04/25/2016 Document Reviewed: 04/25/2016 Elsevier Interactive Patient Education  2018 [employer].      Vomiting, Adult Vomiting occurs when stomach contents are thrown up and out of the mouth. Many people notice nausea before vomiting. Vomiting  can make you feel weak and dehydrated. Dehydration can make you tired and thirsty, cause you to have a dry mouth, and decrease how often you urinate. Older adults and people who have other diseases or a weak immune system are at higher risk for dehydration.It is important to treat vomiting as told by your health care provider. Follow these instructions at home: Follow your health care provider's instructions about how to care for yourself at home. Eating and drinking Follow these recommendations as told by your health care provider:  Take an oral rehydration solution (ORS). This is a drink that is sold at pharmacies and retail stores.  Eat bland, easy-to-digest foods in small amounts as you are able. These foods include bananas, applesauce, rice, lean meats, toast, and crackers.  Drink clear fluids in small amounts as you are able. Clear fluids include water, ice chips, low-calorie sports drinks, and fruit juice that has water added (diluted fruit juice).  Avoid fluids that contain a lot of sugar or caffeine.  Avoid alcohol and foods that are spicy or fatty.  General instructions   Wash your hands frequently with soap and water. If soap and water are not available, use hand  sanitizer. Make sure that everyone in your household washes their hands frequently.  Take over-the-counter and prescription medicines only as told by your health care provider.  Watch your condition for any changes.  Keep all follow-up visits as told by your health care provider. This is important. Contact a health care provider if:  You have a fever.  You are not able to keep fluids down.  Your vomiting gets worse.  You have new symptoms.  You feel light-headed or dizzy.  You have a headache.  You have muscle cramps. Get help right away if:  You have pain in your chest, neck, arm, or jaw.  You feel extremely weak or you faint.  You have persistent vomiting.  You have vomit that is bright red or  looks like black coffee grounds.  You have stools that are bloody or black, or stools that look like tar.  You have severe pain, cramping, or bloating in your abdomen.  You have a severe headache, a stiff neck, or both.  You have a rash.  You have trouble breathing or you are breathing very quickly.  Your heart is beating very quickly.  Your skin feels cold and clammy.  You feel confused.  You have pain while urinating.  You have signs of dehydration, such as: ? Dark urine, or very little or no urine. ? Cracked lips. ? Dry mouth. ? Sunken eyes. ? Sleepiness. ? Weakness. These symptoms may represent a serious problem that is an emergency. Do not wait to see if the symptoms will go away. Get medical help right away. Call your local emergency services (911 in the [place] Do not drive yourself to the hospital. This information is not intended to replace advice given to you by your health care provider. Make sure you discuss any questions you have with your health care provider. Document Released: 02/11/2015 Document Revised: 06/23/2015 Document Reviewed: 09/21/2014 Elsevier Interactive Patient Education  2018 [employer].     IF you received an x-ray today, you will receive an invoice from [facility]. Please contact [facility] at [phone] with questions or concerns regarding your invoice.   IF you received labwork today, you will receive an invoice from [place]. Please contact [facility] at [phone] with questions or concerns regarding your invoice.   Our billing staff will not be able to assist you with questions regarding bills from these companies.  You will be contacted with the lab results as soon as they are available. The fastest way to get your results is to activate your My Chart account. Instructions are located on the last page of this paperwork. If you have not heard from [place] regarding the results in 2 weeks, please contact this office.

## 2017-06-18 NOTE — ED Notes (Signed)
Contrast completed.

## 2017-06-18 NOTE — Progress Notes (Signed)
MRN: [id number] DOB: [birth year]  Subjective:   Joanna Massey is a 37 y.o. female presenting for 4 day history of chills and nausea with vomiting that occurs with having bowel movements. Also reports upper/epigastric belly pain. Has had multiple episodes of vomiting and loose stools. Has had decreased appetite, is having a hard time holding liquids and food down. Denies fever, bloody stools.  Had another episode like this a few months ago, resolved with Phenergan and Protonix.  She continues to take Protonix but would like to know why she is having a recurrence of the symptoms.  Admits that she has smoked marijuana for the past 10 years very consistently, currently has been using marijuana every other day.  Morning has a current medication list which includes the following prescription(s): norethindrone. Also has No Known Allergies.  Joanna Massey denies past medical and surgical history.   Objective:   Vitals: Pulse 73   Temp 98.2 F (36.8 C) (Oral)   Resp 16   Ht 5' 3.5" (1.613 m)   Wt 233 lb 12.8 oz (106.1 kg)   SpO2 100%   BMI 40.77 kg/m   Physical Exam  Constitutional: She is oriented to person, place, and time. She appears well-developed and well-nourished.  HENT:  Mucous membranes dry.  Eyes: Right eye exhibits no discharge. Left eye exhibits no discharge. No scleral icterus.  Cardiovascular: Normal rate, regular rhythm and intact distal pulses. Exam reveals no gallop and no friction rub.  No murmur heard. Pulmonary/Chest: No respiratory distress. She has no wheezes. She has no rales.  Neurological: She is alert and oriented to person, place, and time.  Skin: Skin is warm and dry.  Psychiatric: She has a normal mood and affect.   Results for orders placed or performed in visit on 06/18/17 (from the past 24 hour(s))  POCT urine pregnancy     Status: None   Collection Time: 06/18/17  3:05 PM  Result Value Ref Range   Preg Test, Ur Negative Negative  POCT CBC     Status:  Abnormal   Collection Time: 06/18/17  3:31 PM  Result Value Ref Range   WBC 14.1 (A) 4.6 - 10.2 K/uL   Lymph, poc 2.1 0.6 - 3.4   POC LYMPH PERCENT 15.1 10 - 50 %L   MID (cbc) 0.2 0 - 0.9   POC MID % 1.1 0 - 12 %M   POC Granulocyte 11.8 (A) 2 - 6.9   Granulocyte percent 83.8 (A) 37 - 80 %G   RBC 4.51 4.04 - 5.48 M/uL   Hemoglobin 14.3 12.2 - 16.2 g/dL   HCT, POC 95.2 84.1 - 47.9 %   MCV 95.0 80 - 97 fL   MCH, POC 31.8 (A) 27 - 31.2 pg   MCHC 33.5 31.8 - 35.4 g/dL   RDW, POC 32.4 %   Platelet Count, POC 312 142 - 424 K/uL   MPV 9.6 0 - 99.8 fL  POCT urinalysis dipstick     Status: Abnormal   Collection Time: 06/18/17  4:25 PM  Result Value Ref Range   Color, UA yellow yellow   Clarity, UA hazy (A) clear   Glucose, UA negative negative mg/dL   Bilirubin, UA small (A) negative   Ketones, POC UA moderate (40) (A) negative mg/dL   Spec Grav, UA 4.010 2.725 - 1.025   Blood, UA trace-intact (A) negative   pH, UA 7.0 5.0 - 8.0   Protein Ur, POC =100 (A) negative mg/dL  Urobilinogen, UA 0.2 0.2 or 1.0 E.U./dL   Nitrite, UA Negative Negative   Leukocytes, UA Negative Negative   Assessment and Plan :   Emesis, persistent - Plan: ondansetron (ZOFRAN) injection 4 mg, POCT urine pregnancy, Comprehensive metabolic panel, Thyroid Profile, POCT CBC  Upper abdominal pain - Plan: Comprehensive metabolic panel, Thyroid Profile, POCT CBC  Chills - Plan: POCT CBC  Labs pending, counseled patient on smoking cessation of marijuana.  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  Patient was not receptive to this.  ER precautions reviewed with patient.  Wallis Bamberg, PA-C Primary Care at [facility] [phone] 06/18/2017  3:08 PM

## 2017-06-18 NOTE — ED Provider Notes (Signed)
[facility] EMERGENCY DEPARTMENT Provider Note   CSN: [id number] Arrival date & time: 06/18/17  1731     History   Chief Complaint Chief Complaint  Patient presents with  . Abdominal Pain    HPI Joanna Massey is a 37 y.o. female.  She is presenting with intermittent vomiting since Friday.  She still has a queasy sensation in her stomach.  There is no real particular stomach pain though.  There is been no fever.  She states she is felt chills.  She saw her primary care doctor today who did blood work and was found to have an elevated white count of 14.  Her pregnancy test was negative.  The patient states she is actually had this vomiting symptoms go on for many months and is usually associated with also when she moves her bowels she will have that feeling.  She does smoke cannabis but states she is taken time away from it and does not seem to change her symptoms.  She is recently been on a PPI in case she has some gastritis symptoms.  The history is provided by the patient.  Emesis   This is a recurrent problem. The current episode started more than 2 days ago. The problem occurs 2 to 4 times per day. The problem has not changed since onset.The emesis has an appearance of stomach contents. There has been no fever. Associated symptoms include chills and diarrhea. Pertinent negatives include no abdominal pain, no arthralgias, no cough, no fever, no headaches, no myalgias and no URI.    History reviewed. No pertinent past medical history.  There are no active problems to display for this patient.   History reviewed. No pertinent surgical history.   OB History   None      Home Medications    Prior to Admission medications   Medication Sig Start Date End Date Taking? Authorizing Provider  norethindrone (MICRONOR,CAMILA,ERRIN) 0.35 MG tablet Take 1 tablet by mouth daily.    [provider]  ondansetron (ZOFRAN-ODT) 8 MG disintegrating tablet Take 1 tablet (8  mg total) by mouth every 8 (eight) hours as needed for nausea or vomiting. 06/18/17   Wallis Bamberg, PA-C  pantoprazole (PROTONIX) 40 MG tablet Take 1 tablet by mouth daily. 04/29/17   [provider]    Family History No family history on file.  Social History Social History   Tobacco Use  . Smoking status: Current Every Day Smoker  . Smokeless tobacco: Never Used  Substance Use Topics  . Alcohol use: No  . Drug use: Yes    Types: Marijuana     Allergies   Patient has no known allergies.   Review of Systems Review of Systems  Constitutional: Positive for chills. Negative for fever.  HENT: Negative for ear pain and sore throat.   Eyes: Negative for pain and visual disturbance.  Respiratory: Negative for cough and shortness of breath.   Cardiovascular: Negative for chest pain and palpitations.  Gastrointestinal: Positive for diarrhea and vomiting. Negative for abdominal pain.  Genitourinary: Negative for dysuria, hematuria, vaginal bleeding and vaginal discharge.  Musculoskeletal: Negative for arthralgias, back pain and myalgias.  Skin: Negative for color change and rash.  Neurological: Negative for seizures, syncope and headaches.  All other systems reviewed and are negative.    Physical Exam Updated Vital Signs BP (!) 156/92   Pulse 72   Temp 98.7 F (37.1 C) (Oral)   Resp 20   Ht 5' 3.5" (1.613 m)  Wt 105.7 kg (233 lb)   LMP 06/04/2017   SpO2 100%   BMI 40.63 kg/m   Physical Exam  Constitutional: She appears well-developed and well-nourished. No distress.  HENT:  Head: Normocephalic and atraumatic.  Eyes: Conjunctivae are normal.  Neck: Neck supple.  Cardiovascular: Normal rate and regular rhythm.  No murmur heard. Pulmonary/Chest: Effort normal and breath sounds normal. No respiratory distress.  Abdominal: Soft. There is no tenderness.  Musculoskeletal: Normal range of motion. She exhibits no edema, tenderness or deformity.  Neurological: She  is alert. She has normal strength. No sensory deficit. GCS eye subscore is 4. GCS verbal subscore is 5. GCS motor subscore is 6.  Skin: Skin is warm and dry.  Psychiatric: She has a normal mood and affect.  Nursing note and vitals reviewed.    ED Treatments / Results  Labs (all labs ordered are listed, but only abnormal results are displayed) Labs Reviewed  COMPREHENSIVE METABOLIC PANEL - Abnormal; Notable for the following components:      Result Value   Potassium 3.2 (*)    CO2 21 (*)    Glucose, Bld 111 (*)    Creatinine, Ser 1.07 (*)    ALT 11 (*)    All other components within normal limits    EKG None  Radiology Ct Abdomen Pelvis W Contrast  Result Date: 06/18/2017 CLINICAL DATA:  Intermittent vomiting EXAM: CT ABDOMEN AND PELVIS WITH CONTRAST TECHNIQUE: Multidetector CT imaging of the abdomen and pelvis was performed using the standard protocol following bolus administration of intravenous contrast. CONTRAST:  ISOVUE-300 IOPAMIDOL (ISOVUE-300) INJECTION 61% COMPARISON:  None. FINDINGS: Lower chest: Lung bases demonstrate no acute consolidation or pleural effusion. Normal heart size. Hepatobiliary: Gallstones. No focal hepatic abnormality or biliary dilatation. Pancreas: Unremarkable. No pancreatic ductal dilatation or surrounding inflammatory changes. Spleen: Normal in size without focal abnormality. Adrenals/Urinary Tract: Adrenal glands are within normal limits. Small nonobstructing stones in the left kidney. Negative urinary bladder Stomach/Bowel: Stomach is within normal limits. Appendix appears normal. No evidence of bowel wall thickening, distention, or inflammatory changes. Vascular/Lymphatic: No significant vascular findings are present. No enlarged abdominal or pelvic lymph nodes. Reproductive: Uterus and bilateral adnexa are unremarkable. Other: Negative for free air or free fluid Musculoskeletal: No acute or significant osseous findings. IMPRESSION: 1. No CT  evidence for acute intra-abdominal or pelvic abnormality. 2. Gallstones 3. Nonobstructing small stones in the left kidney Electronically Signed   By: Jasmine Pang M.D.   On: 06/18/2017 21:40    Procedures Procedures (including critical care time)  Medications Ordered in ED Medications  iopamidol (ISOVUE-300) 61 % injection 100 mL (100 mLs Intravenous Contrast Given 06/18/17 2119)  ondansetron (ZOFRAN) injection 4 mg (4 mg Intravenous Given 06/18/17 2146)  haloperidol lactate (HALDOL) injection 2 mg (2 mg Intravenous Given 06/18/17 2215)     Initial Impression / Assessment and Plan / ED Course  I have reviewed the triage vital signs and the nursing notes.  Pertinent labs & imaging results that were available during my care of the patient were reviewed by me and considered in my medical decision making (see chart for details).  Clinical Course as of Jun 19 952  Tue Jun 18, 2017  2239 Reviewed the results of the patient's work-up with her.  She is comfortable going home and I will give her the number for surgeon for follow-up in case the gallstones are possibly precipitant to her symptoms.   [MB]    Clinical Course User Index [MB]  Terrilee Files, MD     Final Clinical Impressions(s) / ED Diagnoses   Final diagnoses:  Non-intractable vomiting with nausea, unspecified vomiting type  Gallstones    ED Discharge Orders    None       Terrilee Files, MD 06/19/17 [phone]

## 2017-06-18 NOTE — ED Triage Notes (Signed)
Abdominal pain x 4 days. Vomiting and chills. She has been taking Zofran and Phenergan.

## 2017-06-18 NOTE — ED Notes (Signed)
Patient ready for CT.

## 2017-06-18 NOTE — Progress Notes (Signed)
2:52 pmPt brought back by CMA to be seen by Urban Gibson, PA-C who was busy in other pt care.  Pt with active emesis throughout triage process.  UPT neg. Order for zofran  IM x 1 now given. 3:54pm  Pt still with active emesis, repeat zofran  IM x 1 now.

## 2017-06-19 LAB — COMPREHENSIVE METABOLIC PANEL
A/G RATIO: 1.6 (ref 1.2–2.2)
ALK PHOS: 67 IU/L (ref 39–117)
ALT: 10 IU/L (ref 0–32)
AST: 13 IU/L (ref 0–40)
Albumin: 4.6 g/dL (ref 3.5–5.5)
BILIRUBIN TOTAL: 0.4 mg/dL (ref 0.0–1.2)
BUN/Creatinine Ratio: 7 — ABNORMAL LOW (ref 9–23)
BUN: 7 mg/dL (ref 6–20)
CHLORIDE: 105 mmol/L (ref 96–106)
CO2: 17 mmol/L — AB (ref 20–29)
Calcium: 9.9 mg/dL (ref 8.7–10.2)
Creatinine, Ser: 0.98 mg/dL (ref 0.57–1.00)
GFR calc Af Amer: 86 mL/min/{1.73_m2} (ref >59)
GFR calc non Af Amer: 74 mL/min/{1.73_m2} (ref >59)
Globulin, Total: 2.8 g/dL (ref 1.5–4.5)
Glucose: 101 mg/dL — ABNORMAL HIGH (ref 65–99)
POTASSIUM: 3.7 mmol/L (ref 3.5–5.2)
Sodium: 144 mmol/L (ref 134–144)
Total Protein: 7.4 g/dL (ref 6.0–8.5)

## 2017-06-19 LAB — LIPASE: Lipase: 15 U/L (ref 14–72)

## 2017-06-19 LAB — THYROID PANEL
Free Thyroxine Index: 2.1 (ref 1.2–4.9)
T3 Uptake Ratio: 25 % (ref 24–39)
T4, Total: 8.2 ug/dL (ref 4.5–12.0)

## 2017-07-11 ENCOUNTER — Telehealth: Payer: Self-pay

## 2017-07-11 NOTE — Telephone Encounter (Signed)
Copied from [place] [phone]. Topic: General - Other >> Jul 11, 2017  8:30 AM Oneta Rack wrote: Relation to pt: self  Call back number:[phone]   Reason for call:  Patient was seen 06/18/17 by Jaynee Eagles and would like PCP to fill out FMLA paperwork reflecting 06/17/17 to 06/20/17, patient states [employer] gave her the deadline of being Monday, 07/15/17 . Patient faxing over form to [phone], please advise when received .  >> Jul 11, 2017  8:39 AM Oneta Rack wrote: Relation to pt: self  Call back number:[phone]   Reason for call:  Patient was seen 06/18/17 by Jaynee Eagles and would like PCP to fill out FMLA paperwork reflecting 06/17/17 to 06/20/17, patient states [employer] gave her the deadline of being Monday, 07/15/17 . Patient faxing over form to [phone], please advise when received .    I called pt - have not received fax yet.  She said [employer] may email to her and she will bring over today.  Needs completed form to [employer] by Monday.    Sent message to [place] to monitor.

## 2017-07-13 ENCOUNTER — Telehealth: Payer: Self-pay | Admitting: Urgent Care

## 2017-07-13 NOTE — Telephone Encounter (Signed)
Copied from [place] [phone]. Topic: General - Other >> Jul 11, 2017  8:30 AM Oneta Rack wrote: Relation to pt: self  Call back number:[phone]   Reason for call:  Patient was seen 06/18/17 by Jaynee Eagles and would like PCP to fill out FMLA paperwork reflecting 06/17/17 to 06/20/17, patient states [employer] gave her the deadline of being Monday, 07/15/17 . Patient faxing over form to [phone], please advise when received .  >> Jul 11, 2017  8:39 AM Oneta Rack wrote: Relation to pt: self  Call back number:[phone]   Reason for call:  Patient was seen 06/18/17 by Jaynee Eagles and would like PCP to fill out FMLA paperwork reflecting 06/17/17 to 06/20/17, patient states [employer] gave her the deadline of being Monday, 07/15/17 . Patient faxing over form to [phone], please advise when received .  >> Jul 12, 2017  8:29 AM Celedonio Savage L wrote: Pt calling back about FMLA paperwork that's due on Monday she dropped paperwork off yesterday around 12

## 2017-07-13 NOTE — Telephone Encounter (Signed)
This is the first day hear of this request including both my epic and paper box.  I am not the patient's PCP and I saw the patient for an acute visit once.  During that time I did not discuss FMLA or disability with the patient at all.  I am not comfortable filling FMLA paperwork for this patient given that I only saw her for an acute visit and did not have any follow-up, and I am not her PCP.

## 2017-07-14 NOTE — Telephone Encounter (Signed)
Message sent to [facility] RE: FMLA forms/ information

## 2017-07-15 NOTE — Telephone Encounter (Signed)
I do not handle FMLA.  This needs to go to [place].  The PEC put this in to [place] at the request of the patient.

## 2017-07-16 NOTE — Telephone Encounter (Signed)
Paperwork will not be completed per Marquita PalmsMario patient needs to be seen by her PCP.

## 2018-09-17 IMAGING — CT CT ABD-PELV W/ CM
2 of 4 series · 16 of 46 positions shown, 18 images · IV contrast (APPLIED)
Comparison: None.

CLINICAL DATA: Intermittent vomiting

EXAM:
CT ABDOMEN AND PELVIS WITH CONTRAST
TECHNIQUE: Multidetector CT imaging of the abdomen and pelvis was performed
using the standard protocol following bolus administration of
intravenous contrast.
CONTRAST:  100mL VQ6ZRW-VDD IOPAMIDOL (VQ6ZRW-VDD) INJECTION 61%

[Series 2: axial st · axial · 0.84mm/px · z∈[-506,-70]mm · 13 of 95 slices shown, 15 images]
[im 4/95  soft-tissue]
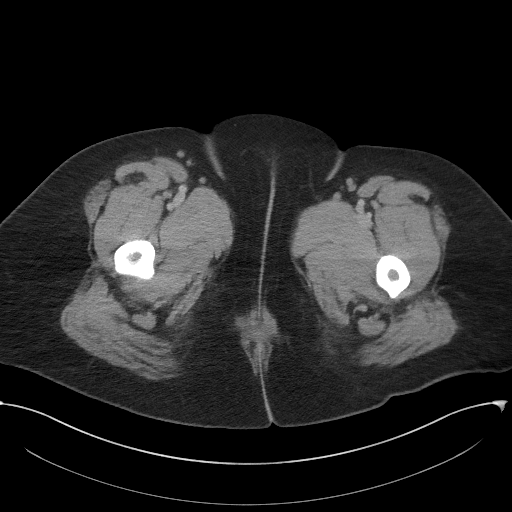
[im 4/95  bone]
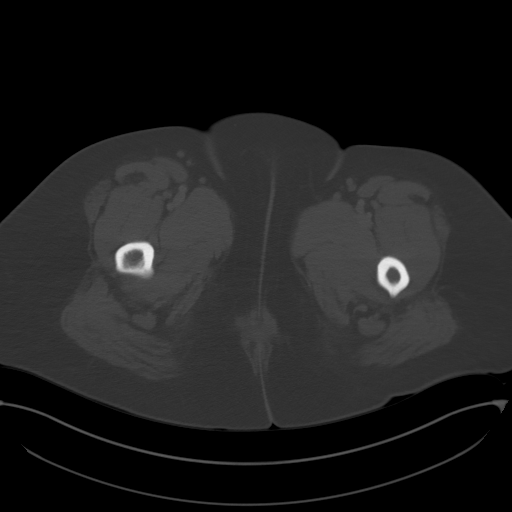
[im 12/95  soft-tissue]
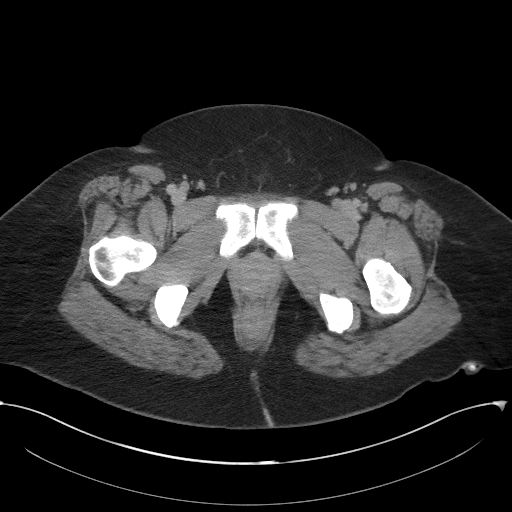
[im 19/95  soft-tissue]
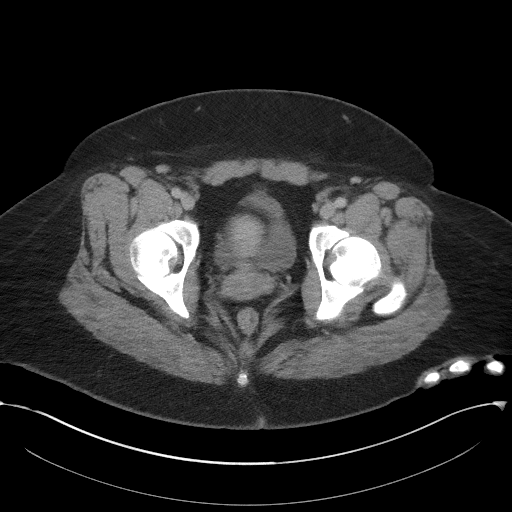
[im 27/95  soft-tissue]
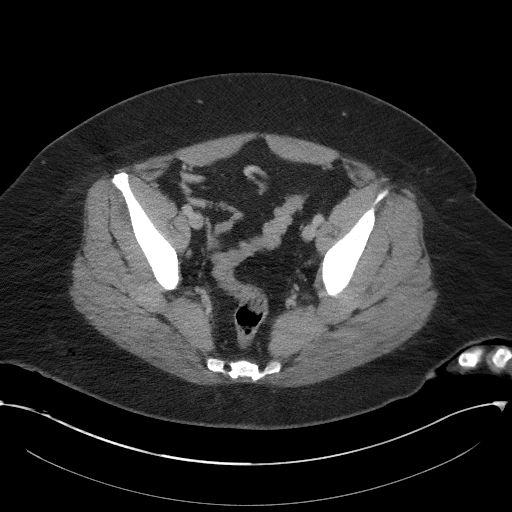
[im 34/95  soft-tissue]
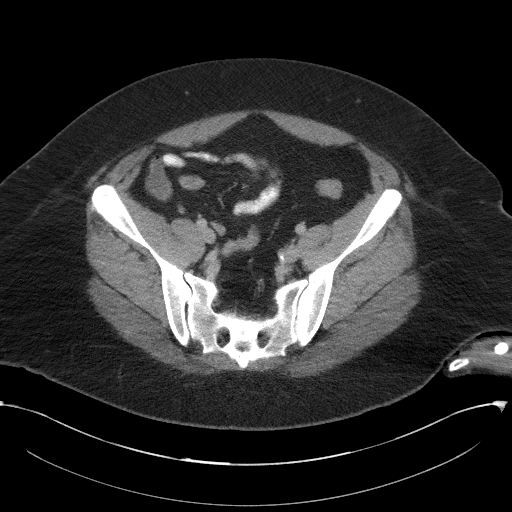
[im 42/95  soft-tissue]
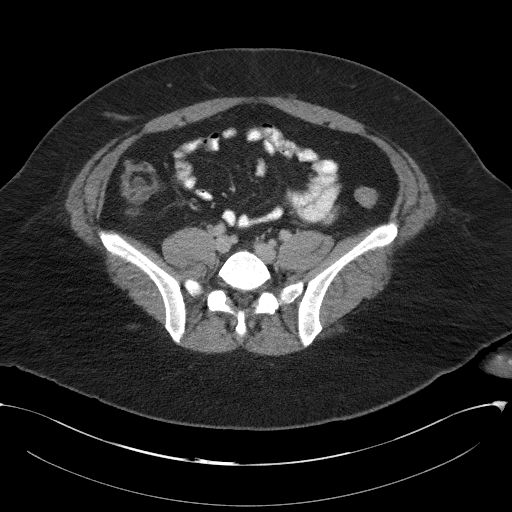
[im 49/95  soft-tissue]
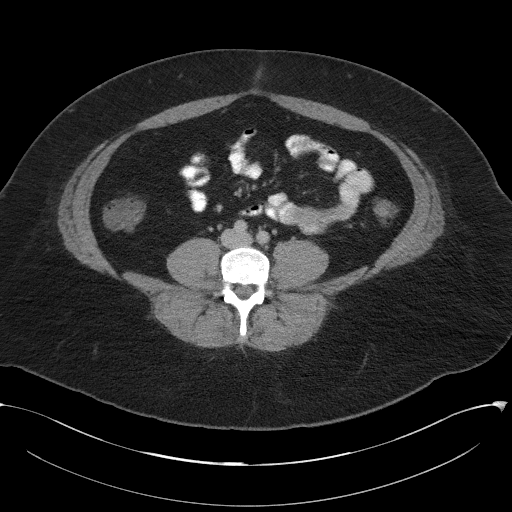
[im 53/95  soft-tissue]
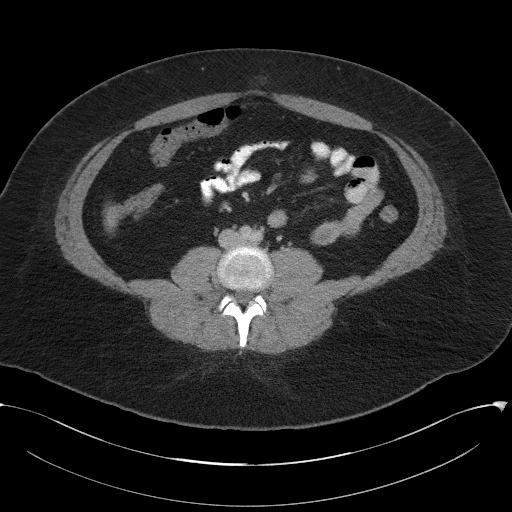
[im 61/95  soft-tissue]
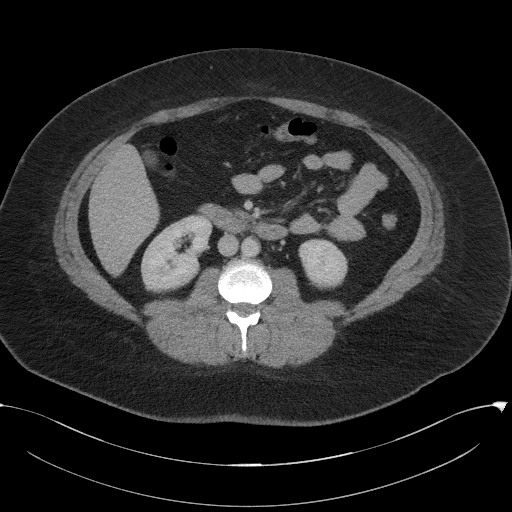
[im 61/95  bone]
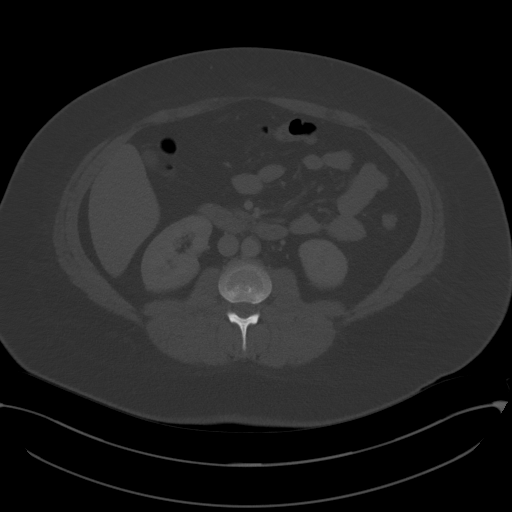
[im 68/95  soft-tissue]
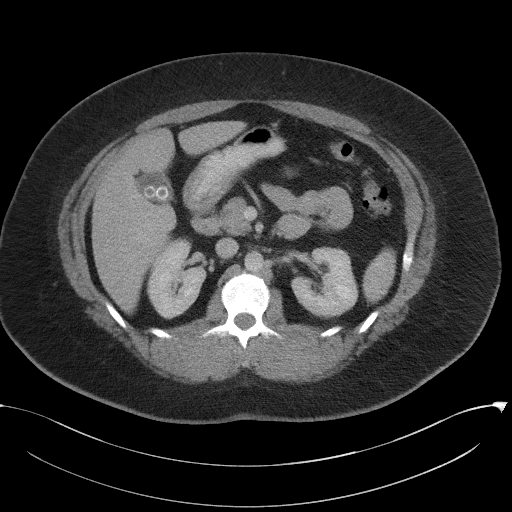
[im 76/95  soft-tissue]
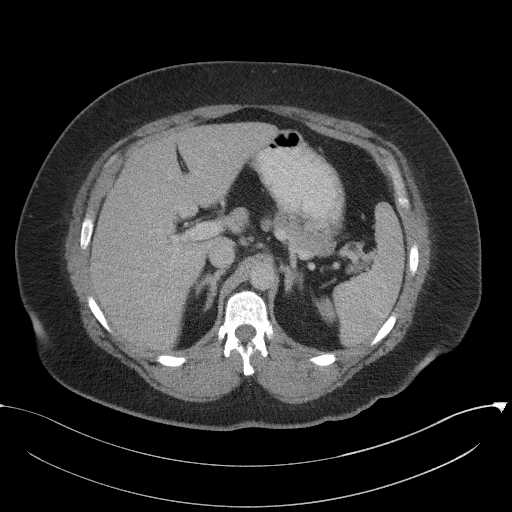
[im 83/95  soft-tissue]
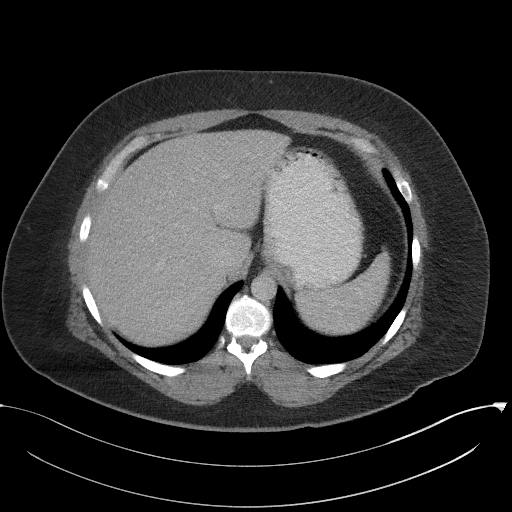
[im 91/95  soft-tissue]
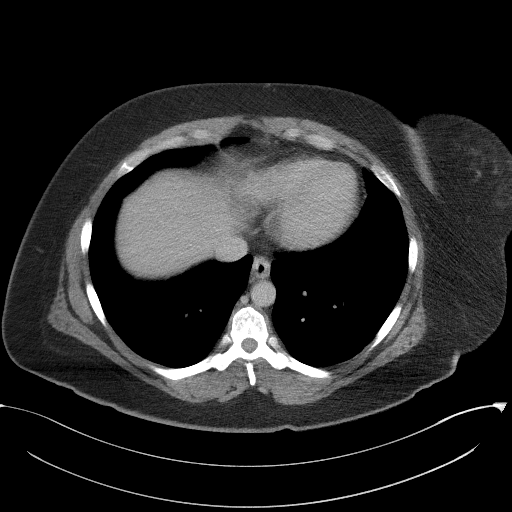

[Series 5: coronal st · coronal · 0.75mm/px · 3 of 101 slices shown]
[im 34/101  soft-tissue]
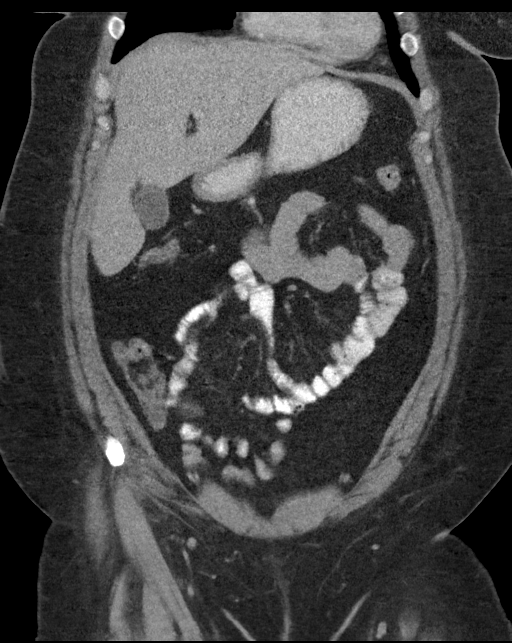
[im 45/101  soft-tissue]
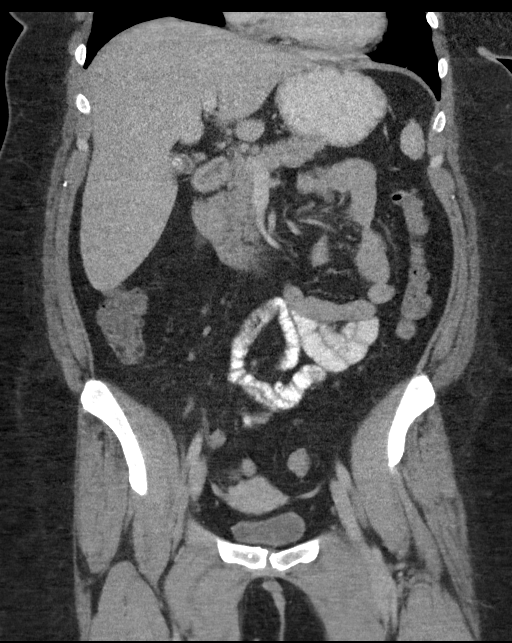
[im 56/101  soft-tissue]
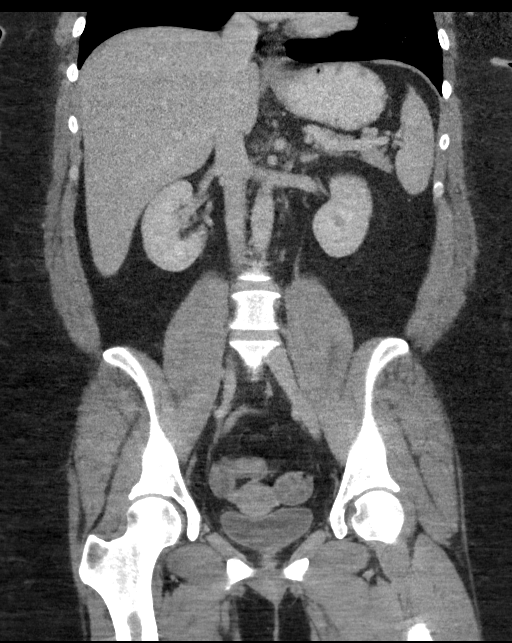

[16 of 46 positions shown; findings below may reference images not displayed]

FINDINGS: Lower chest: Lung bases demonstrate no acute consolidation or
pleural effusion. Normal heart size.

Hepatobiliary: Gallstones. No focal hepatic abnormality or biliary
dilatation.

Pancreas: Unremarkable. No pancreatic ductal dilatation or
surrounding inflammatory changes.

Spleen: Normal in size without focal abnormality.

Adrenals/Urinary Tract: Adrenal glands are within normal limits.
Small nonobstructing stones in the left kidney. Negative urinary
bladder

Stomach/Bowel: Stomach is within normal limits. Appendix appears
normal. No evidence of bowel wall thickening, distention, or
inflammatory changes.

Vascular/Lymphatic: No significant vascular findings are present. No
enlarged abdominal or pelvic lymph nodes.

Reproductive: Uterus and bilateral adnexa are unremarkable.

Other: Negative for free air or free fluid

Musculoskeletal: No acute or significant osseous findings.
IMPRESSION: 1. No CT evidence for acute intra-abdominal or pelvic abnormality.
2. Gallstones
3. Nonobstructing small stones in the left kidney

## 2019-07-21 ENCOUNTER — Other Ambulatory Visit: Payer: Self-pay | Admitting: Obstetrics & Gynecology

## 2019-07-21 DIAGNOSIS — N979 Female infertility, unspecified: Secondary | ICD-10-CM

## 2019-07-27 ENCOUNTER — Ambulatory Visit
Admission: RE | Admit: 2019-07-27 | Discharge: 2019-07-27 | Disposition: A | Discharge status: Home / Self Care | Payer: BLUE CROSS/BLUE SHIELD | Source: Ambulatory Visit | Attending: Obstetrics & Gynecology | Admitting: Obstetrics & Gynecology

## 2019-07-27 DIAGNOSIS — N979 Female infertility, unspecified: Secondary | ICD-10-CM

## 2019-10-22 ENCOUNTER — Encounter: Payer: Self-pay | Admitting: Gastroenterology

## 2019-11-11 ENCOUNTER — Other Ambulatory Visit: Payer: Self-pay | Admitting: Obstetrics and Gynecology

## 2019-11-13 ENCOUNTER — Encounter (HOSPITAL_BASED_OUTPATIENT_CLINIC_OR_DEPARTMENT_OTHER): Payer: Self-pay | Admitting: Obstetrics and Gynecology

## 2019-11-13 ENCOUNTER — Other Ambulatory Visit: Payer: Self-pay

## 2019-11-13 NOTE — Progress Notes (Signed)
Spoke w/ via phone for pre-op interview--- pt Lab needs dos----  Urine poct,  t and s             Lab results------none COVID test ------11-23-2019 1000 Arrive at -------1215 11-25-2019 NPO after MN NO Solid Food.  Clear liquids from MN until---1115 am then npo Medications to take morning of surgery -----gabapentin prn, pantaptazole, zyrtec Diabetic medication -----n/a Patient Special Instructions -----none Pre-Op special Istructions -----none Patient verbalized understanding of instructions that were given at this phone interview. Patient denies shortness of breath, chest pain, fever, cough at this phone interview.

## 2019-11-23 ENCOUNTER — Other Ambulatory Visit (HOSPITAL_COMMUNITY)
Admission: RE | Admit: 2019-11-23 | Discharge: 2019-11-23 | Disposition: A | Discharge status: Home / Self Care | Payer: BC Managed Care – PPO | Source: Ambulatory Visit | Attending: Obstetrics and Gynecology | Admitting: Obstetrics and Gynecology

## 2019-11-23 DIAGNOSIS — Z01812 Encounter for preprocedural laboratory examination: Secondary | ICD-10-CM | POA: Diagnosis not present

## 2019-11-23 DIAGNOSIS — Z20822 Contact with and (suspected) exposure to covid-19: Secondary | ICD-10-CM | POA: Diagnosis not present

## 2019-11-23 LAB — SARS CORONAVIRUS 2 (TAT 6-24 HRS): SARS Coronavirus 2: NEGATIVE

## 2019-11-24 ENCOUNTER — Ambulatory Visit: Payer: BC Managed Care – PPO | Admitting: Gastroenterology

## 2019-11-25 ENCOUNTER — Ambulatory Visit (HOSPITAL_COMMUNITY): Payer: BC Managed Care – PPO

## 2019-11-25 ENCOUNTER — Ambulatory Visit (HOSPITAL_BASED_OUTPATIENT_CLINIC_OR_DEPARTMENT_OTHER): Payer: BC Managed Care – PPO | Admitting: Anesthesiology

## 2019-11-25 ENCOUNTER — Encounter (HOSPITAL_BASED_OUTPATIENT_CLINIC_OR_DEPARTMENT_OTHER)
Admission: RE | Disposition: A | Discharge status: Home / Self Care | Payer: Self-pay | Source: Home / Self Care | Attending: Obstetrics and Gynecology

## 2019-11-25 ENCOUNTER — Encounter (HOSPITAL_BASED_OUTPATIENT_CLINIC_OR_DEPARTMENT_OTHER): Payer: Self-pay | Admitting: Obstetrics and Gynecology

## 2019-11-25 ENCOUNTER — Ambulatory Visit (HOSPITAL_BASED_OUTPATIENT_CLINIC_OR_DEPARTMENT_OTHER)
Admission: RE | Admit: 2019-11-25 | Discharge: 2019-11-25 | Disposition: A | Discharge status: Home / Self Care | Payer: BC Managed Care – PPO | Attending: Obstetrics and Gynecology | Admitting: Obstetrics and Gynecology

## 2019-11-25 DIAGNOSIS — N840 Polyp of corpus uteri: Secondary | ICD-10-CM | POA: Insufficient documentation

## 2019-11-25 DIAGNOSIS — N971 Female infertility of tubal origin: Secondary | ICD-10-CM | POA: Diagnosis not present

## 2019-11-25 DIAGNOSIS — Z87891 Personal history of nicotine dependence: Secondary | ICD-10-CM | POA: Insufficient documentation

## 2019-11-25 HISTORY — DX: Gastro-esophageal reflux disease without esophagitis: K21.9

## 2019-11-25 HISTORY — PX: HYSTEROSCOPY: SHX211

## 2019-11-25 HISTORY — DX: Headache, unspecified: R51.9

## 2019-11-25 LAB — TYPE AND SCREEN
ABO/RH(D): O POS
Antibody Screen: NEGATIVE

## 2019-11-25 LAB — POCT PREGNANCY, URINE: Preg Test, Ur: NEGATIVE

## 2019-11-25 LAB — ABO/RH: ABO/RH(D): O POS

## 2019-11-25 SURGERY — HYSTEROSCOPY
Anesthesia: General | Site: Vagina | Laterality: Right

## 2019-11-25 MED ORDER — SCOPOLAMINE 1 MG/3DAYS TD PT72: 1.0000 | MEDICATED_PATCH | TRANSDERMAL | Status: DC

## 2019-11-25 MED ORDER — DEXMEDETOMIDINE (PRECEDEX) IN NS 20 MCG/5ML (4 MCG/ML) IV SYRINGE
PREFILLED_SYRINGE | INTRAVENOUS | Status: AC
  Filled 2019-11-25: qty 5

## 2019-11-25 MED ORDER — CEFAZOLIN SODIUM-DEXTROSE 2-4 GM/100ML-% IV SOLN
INTRAVENOUS | Status: AC
  Filled 2019-11-25: qty 100

## 2019-11-25 MED ORDER — LACTATED RINGERS IV SOLN: INTRAVENOUS | Status: DC

## 2019-11-25 MED ORDER — SCOPOLAMINE 1 MG/3DAYS TD PT72
1.0000 | MEDICATED_PATCH | TRANSDERMAL | Status: DC
  Administered 2019-11-25: 1.5 mg via TRANSDERMAL

## 2019-11-25 MED ORDER — SCOPOLAMINE 1 MG/3DAYS TD PT72
MEDICATED_PATCH | TRANSDERMAL | Status: AC
  Filled 2019-11-25: qty 1

## 2019-11-25 MED ORDER — FENTANYL CITRATE (PF) 100 MCG/2ML IJ SOLN
INTRAMUSCULAR | Status: AC
  Filled 2019-11-25: qty 2

## 2019-11-25 MED ORDER — LIDOCAINE 2% (20 MG/ML) 5 ML SYRINGE
INTRAMUSCULAR | Status: AC
  Filled 2019-11-25: qty 5

## 2019-11-25 MED ORDER — DEXMEDETOMIDINE HCL 200 MCG/2ML IV SOLN
INTRAVENOUS | Status: DC | PRN
  Administered 2019-11-25: 8 ug via INTRAVENOUS
  Administered 2019-11-25: 4 ug via INTRAVENOUS

## 2019-11-25 MED ORDER — ONDANSETRON HCL 4 MG/2ML IJ SOLN
INTRAMUSCULAR | Status: AC
  Filled 2019-11-25: qty 2

## 2019-11-25 MED ORDER — FENTANYL CITRATE (PF) 100 MCG/2ML IJ SOLN
INTRAMUSCULAR | Status: DC | PRN
  Administered 2019-11-25 (×4): 50 ug via INTRAVENOUS

## 2019-11-25 MED ORDER — DEXAMETHASONE SODIUM PHOSPHATE 10 MG/ML IJ SOLN
INTRAMUSCULAR | Status: AC
  Filled 2019-11-25: qty 1

## 2019-11-25 MED ORDER — POVIDONE-IODINE 10 % EX SWAB: 2.0000 "application " | Freq: Once | CUTANEOUS | Status: DC

## 2019-11-25 MED ORDER — PROPOFOL 10 MG/ML IV BOLUS
INTRAVENOUS | Status: AC
  Filled 2019-11-25: qty 20

## 2019-11-25 MED ORDER — KETOROLAC TROMETHAMINE 30 MG/ML IJ SOLN
INTRAMUSCULAR | Status: DC | PRN
  Administered 2019-11-25: 30 mg via INTRAVENOUS

## 2019-11-25 MED ORDER — AMISULPRIDE (ANTIEMETIC) 5 MG/2ML IV SOLN: 10.0000 mg | Freq: Once | INTRAVENOUS | Status: DC | PRN

## 2019-11-25 MED ORDER — DEXAMETHASONE SODIUM PHOSPHATE 4 MG/ML IJ SOLN
INTRAMUSCULAR | Status: DC | PRN
  Administered 2019-11-25: 10 mg via INTRAVENOUS

## 2019-11-25 MED ORDER — LIDOCAINE HCL (CARDIAC) PF 100 MG/5ML IV SOSY
PREFILLED_SYRINGE | INTRAVENOUS | Status: DC | PRN
  Administered 2019-11-25: 100 mg via INTRAVENOUS

## 2019-11-25 MED ORDER — ONDANSETRON HCL 4 MG/2ML IJ SOLN
INTRAMUSCULAR | Status: DC | PRN
  Administered 2019-11-25: 4 mg via INTRAVENOUS

## 2019-11-25 MED ORDER — PROMETHAZINE HCL 25 MG/ML IJ SOLN
INTRAMUSCULAR | Status: AC
  Filled 2019-11-25: qty 1

## 2019-11-25 MED ORDER — CEFAZOLIN SODIUM-DEXTROSE 2-3 GM-%(50ML) IV SOLR
INTRAVENOUS | Status: DC | PRN
  Administered 2019-11-25: 2 g via INTRAVENOUS

## 2019-11-25 MED ORDER — MIDAZOLAM HCL 2 MG/2ML IJ SOLN
INTRAMUSCULAR | Status: AC
  Filled 2019-11-25: qty 2

## 2019-11-25 MED ORDER — MIDAZOLAM HCL 5 MG/5ML IJ SOLN
INTRAMUSCULAR | Status: DC | PRN
  Administered 2019-11-25: 2 mg via INTRAVENOUS

## 2019-11-25 MED ORDER — IOHEXOL 300 MG/ML  SOLN
INTRAMUSCULAR | Status: DC | PRN
  Administered 2019-11-25: 32 mL

## 2019-11-25 MED ORDER — PROPOFOL 10 MG/ML IV BOLUS
INTRAVENOUS | Status: DC | PRN
  Administered 2019-11-25: 200 mg via INTRAVENOUS

## 2019-11-25 MED ORDER — PROMETHAZINE HCL 25 MG/ML IJ SOLN
6.2500 mg | INTRAMUSCULAR | Status: DC | PRN
  Administered 2019-11-25: 6.25 mg via INTRAVENOUS

## 2019-11-25 MED ORDER — ACETAMINOPHEN 500 MG PO TABS
1000.0000 mg | ORAL_TABLET | Freq: Once | ORAL | Status: AC
  Administered 2019-11-25: 1000 mg via ORAL

## 2019-11-25 MED ORDER — ACETAMINOPHEN 500 MG PO TABS
ORAL_TABLET | ORAL | Status: AC
  Filled 2019-11-25: qty 2

## 2019-11-25 MED ORDER — KETOROLAC TROMETHAMINE 30 MG/ML IJ SOLN
INTRAMUSCULAR | Status: AC
  Filled 2019-11-25: qty 1

## 2019-11-25 MED ORDER — OXYCODONE HCL 5 MG PO TABS: 5.0000 mg | ORAL_TABLET | Freq: Once | ORAL | Status: DC | PRN

## 2019-11-25 MED ORDER — OXYCODONE HCL 5 MG/5ML PO SOLN: 5.0000 mg | Freq: Once | ORAL | Status: DC | PRN

## 2019-11-25 MED ORDER — FENTANYL CITRATE (PF) 100 MCG/2ML IJ SOLN
25.0000 ug | INTRAMUSCULAR | Status: DC | PRN
  Administered 2019-11-25: 25 ug via INTRAVENOUS

## 2019-11-25 MED ORDER — AMISULPRIDE (ANTIEMETIC) 5 MG/2ML IV SOLN
INTRAVENOUS | Status: AC
  Filled 2019-11-25: qty 4

## 2019-11-25 MED ORDER — SODIUM CHLORIDE 0.9 % IR SOLN
Status: DC | PRN
  Administered 2019-11-25: 2000 mL

## 2019-11-25 MED ORDER — TRAMADOL HCL 50 MG PO TABS
50.0000 mg | ORAL_TABLET | Freq: Four times a day (QID) | ORAL | 0 refills | Status: AC | PRN
Start: 2019-11-25 — End: 2020-11-24

## 2019-11-25 MED ORDER — ACETAMINOPHEN 500 MG PO TABS: 1000.0000 mg | ORAL_TABLET | Freq: Once | ORAL | Status: DC

## 2019-11-25 SURGICAL SUPPLY — 27 items
BIPOLAR CUTTING LOOP 21FR (ELECTRODE)
BRR ADH 6X5 SEPRAFILM 1 SHT (MISCELLANEOUS)
CANISTER SUCT 3000ML PPV (MISCELLANEOUS) IMPLANT
CANNULA CURETTE W/SYR 6 (CANNULA) ×2 IMPLANT
CANNULA CURETTE W/SYR 6MM (CANNULA) ×1
CANNULA CURETTE W/SYR 7 (CANNULA) IMPLANT
CANNULA CURETTE W/SYR 7MM (CANNULA)
CATH NOVY CORNUAL CURVED 5.0 (CATHETERS) ×3 IMPLANT
CATH ROBINSON RED A/P 16FR (CATHETERS) ×3 IMPLANT
COVER WAND RF STERILE (DRAPES) ×3 IMPLANT
DILATOR CANAL MILEX (MISCELLANEOUS) IMPLANT
DRAPE C-ARM 42X120 X-RAY (DRAPES) ×3 IMPLANT
ELECT BIPOLAR KNIFE NDL PTD 7M (ELECTRODE) IMPLANT
GAUZE 4X4 16PLY RFD (DISPOSABLE) ×3 IMPLANT
GLOVE BIO SURGEON STRL SZ8 (GLOVE) ×3 IMPLANT
GOWN STRL REUS W/TWL XL LVL3 (GOWN DISPOSABLE) ×6 IMPLANT
IV NS IRRIG 3000ML ARTHROMATIC (IV SOLUTION) ×3 IMPLANT
KIT PROCEDURE FLUENT (KITS) IMPLANT
LOOP CUTTING BIPOLAR 21FR (ELECTRODE) IMPLANT
PACK VAGINAL MINOR WOMEN LF (CUSTOM PROCEDURE TRAY) ×3 IMPLANT
PAD OB MATERNITY 4.3X12.25 (PERSONAL CARE ITEMS) ×3 IMPLANT
SEPRAFILM MEMBRANE 5X6 (MISCELLANEOUS) IMPLANT
SET IRRIG Y TYPE TUR BLADDER L (SET/KITS/TRAYS/PACK) ×3 IMPLANT
STENT BALLN UTERINE 3CM 6FR (STENTS) IMPLANT
STENT BALLN UTERINE 4CM 6FR (STENTS) IMPLANT
SYRINGE 3CC/18X1.5 ECLIPSE (MISCELLANEOUS) IMPLANT
TOWEL OR 17X26 10 PK STRL BLUE (TOWEL DISPOSABLE) ×3 IMPLANT

## 2019-11-25 NOTE — Transfer of Care (Signed)
Immediate Anesthesia Transfer of Care Note  Patient: Joanna Massey  Procedure(s) Performed: Procedure(s) (LRB): HYSTEROSCOPY RIGHT TUBAL RECANNULAZATION INTRAOPERATIVE HSG WITH FTC CATHER SET, POLYPECTOMY (Right)  Patient Location: PACU  Anesthesia Type: General  Level of Consciousness: awake, sedated, patient cooperative and responds to stimulation  Airway & Oxygen Therapy: Patient Spontanous Breathing and Patient connected to [place] 02 and soft FM   Post-op Assessment: Report given to PACU RN, Post -op Vital signs reviewed and stable and Patient moving all extremities  Post vital signs: Reviewed and stable  Complications: No apparent anesthesia complications

## 2019-11-25 NOTE — Anesthesia Procedure Notes (Signed)
Procedure Name: LMA Insertion Date/Time: 11/25/2019 3:15 PM Performed by: Jessica Priest, CRNA Pre-anesthesia Checklist: Patient identified, Emergency Drugs available, Suction available, Patient being monitored and Timeout performed Patient Re-evaluated:Patient Re-evaluated prior to induction Oxygen Delivery Method: Circle system utilized Preoxygenation: Pre-oxygenation with 100% oxygen Induction Type: IV induction Ventilation: Mask ventilation without difficulty LMA: LMA inserted LMA Size: 4.0 Number of attempts: 1 Airway Equipment and Method: Bite block Placement Confirmation: positive ETCO2,  breath sounds checked- equal and bilateral and CO2 detector Tube secured with: Tape Dental Injury: Teeth and Oropharynx as per pre-operative assessment

## 2019-11-25 NOTE — Anesthesia Preprocedure Evaluation (Addendum)
Anesthesia Evaluation  Patient identified by MRN, date of birth, ID band  Reviewed: Allergy & Precautions, NPO status , Patient's Chart, lab work & pertinent test results  Airway Mallampati: II  TM Distance: >3 FB Neck ROM: Full    Dental no notable dental hx.    Pulmonary neg pulmonary ROS, Patient abstained from smoking., former smoker,    Pulmonary exam normal breath sounds clear to auscultation       Cardiovascular Exercise Tolerance: Good negative cardio ROS Normal cardiovascular exam Rhythm:Regular Rate:Normal     Neuro/Psych  Headaches, negative psych ROS   GI/Hepatic GERD  ,(+)     substance abuse (last used marijuana this AM)  marijuana use,   Endo/Other  Morbid obesity  Renal/GU   negative genitourinary   Musculoskeletal negative musculoskeletal ROS (+)   Abdominal   Peds negative pediatric ROS (+)  Hematology negative hematology ROS (+)   Anesthesia Other Findings   Reproductive/Obstetrics infertility                            Anesthesia Physical Anesthesia Plan  ASA: III  Anesthesia Plan: General   Post-op Pain Management:    Induction:   PONV Risk Score and Plan: 3 and Scopolamine patch - Pre-op, Midazolam, Dexamethasone and Ondansetron  Airway Management Planned: LMA  Additional Equipment: None  Intra-op Plan:   Post-operative Plan: Extubation in OR  Informed Consent: I have reviewed the patients History and Physical, chart, labs and discussed the procedure including the risks, benefits and alternatives for the proposed anesthesia with the patient or authorized representative who has indicated his/her understanding and acceptance.       Plan Discussed with: CRNA and Anesthesiologist  Anesthesia Plan Comments:        Anesthesia Quick Evaluation

## 2019-11-25 NOTE — Anesthesia Postprocedure Evaluation (Signed)
Anesthesia Post Note  Patient: Joanna Massey  Procedure(s) Performed: HYSTEROSCOPY RIGHT TUBAL RECANNULAZATION INTRAOPERATIVE HSG WITH FTC CATHER SET, POLYPECTOMY (Right Vagina )     Patient location during evaluation: PACU Anesthesia Type: General Level of consciousness: awake and alert Pain management: pain level controlled Vital Signs Assessment: post-procedure vital signs reviewed and stable Respiratory status: spontaneous breathing, nonlabored ventilation and respiratory function stable Cardiovascular status: blood pressure returned to baseline and stable Postop Assessment: no apparent nausea or vomiting Anesthetic complications: no   No complications documented.  Last Vitals:  Vitals:   11/25/19 1630 11/25/19 1700  BP: (!) 137/91 (!) 145/99  Pulse: 68 64  Resp: 15 12  Temp:  36.6 C  SpO2: 100% 99%    Last Pain:  Vitals:   11/25/19 1630  TempSrc:   PainSc: 2                  Lowella Curb

## 2019-11-25 NOTE — Op Note (Signed)
OPERATIVE NOTE  Preoperative diagnosis: Right proximal tubal occlusion, infertility  Postoperative diagnosis: Right proximal tubal occlusion, endometrial polyp, infertility  Procedure: Hysteroscopy, right tubal fluoroscopic recanalization, polypectomy,  D&C  Surgeon: Governor Specking  Anesthesia: General  Complications: None  Estimated blood loss: Less than 20 mL  Specimen: Endometrial curettings to pathology  Findings: Endocervical canal appeared normal. The uterus sounded to 8 cm. Endometrial cavity had 20 x 15 x 10 mm mm fundal midline sessile polyp. Otherwise it was of normal appearance and normal configuration. Both tubal ostia were seen. Following tubal recanalization, the right fallopian tube filled and spilled  Description of procedure: Patient was placed in dorsal supine position. General anesthesia was administered. She was placed in lithotomy position. She was prepped and draped in sterile manner. A vaginal speculum was placed. A Slimline hysteroscope with 30 lens was inserted into the canal and above findings were noted. Distention medium was saline.  Distention method was gravity.  Above findings were noted.  We wedged in a Novy catheter into the right tubal ostium.  Selective salpingoscopy using Omnipaque showed tubal opacification and spillage.  Nevertheless, we also passed a cornual access catheter (3 [place]) with a 0.018 inch guidewire into the proximal tube.  No resistance was met.  The guidewire was pulled out and selective salpingoscopy was repeated with Omnipaque.  Fluoroscopic images were obtained and they showed tubal opacification again and spillage.  This portion of the procedure was terminated.   Using hysteroscopic scissors the polyp was excised right at its stalk and then grasped with hysteroscopic graspers and removed and submitted to pathology. Using a Handy Vac manual evacuation device with an attached 6 mm curette the endometrial cavity was gently curetted and  the specimen was sent to pathology.  Hemostasis was insured. Instrument count was correct. Estimated blood loss was less than 20 mL. The patient tolerated the procedure well and was transferred to recovery in satisfactory condition.  Governor Specking, MD

## 2019-11-25 NOTE — Discharge Instructions (Signed)
DISCHARGE INSTRUCTIONS: HYSTEROSCOPY / ENDOMETRIAL ABLATION The following instructions have been prepared to help you care for yourself upon your return home.  May take stool softner while taking narcotic pain medication to prevent constipation.  Drink plenty of water.  Personal hygiene:  Use sanitary pads for vaginal drainage, not tampons.  Shower the day after your procedure.  NO tub baths, pools or Jacuzzis for 2-3 weeks.  Wipe front to back after using the bathroom.  Activity and limitations:  Do NOT drive or operate any equipment for 24 hours. The effects of anesthesia are still present and drowsiness may result.  Do NOT rest in bed all day.  Walking is encouraged.  Walk up and down stairs slowly.  You may resume your normal activity in one to two days or as indicated by your physician. Sexual activity: NO intercourse for at least 2 weeks after the procedure, or as indicated by your Doctor.  Diet: Eat a light meal as desired this evening. You may resume your usual diet tomorrow.  Return to Work: You may resume your work activities in one to two days or as indicated by Therapist, sports.  What to expect after your surgery: Expect to have vaginal bleeding/discharge for 2-3 days and spotting for up to 10 days. It is not unusual to have soreness for up to 1-2 weeks. You may have a slight burning sensation when you urinate for the first day. Mild cramps may continue for a couple of days. You may have a regular period in 2-6 weeks.  Call your doctor for any of the following:  Excessive vaginal bleeding or clotting, saturating and changing one pad every hour.  Inability to urinate 6 hours after discharge from hospital.  Pain not relieved by pain medication.  Fever of 100.4 F or greater.  Unusual vaginal discharge or odor.  Post Anesthesia Home Care Instructions  Activity: Get plenty of rest for the remainder of the day. A responsible individual must stay with you for  24 hours following the procedure.  For the next 24 hours, DO NOT: -Drive a car -Advertising copywriter -Drink alcoholic beverages -Take any medication unless instructed by your physician -Make any legal decisions or sign important papers.  Meals: Start with liquid foods such as gelatin or soup. Progress to regular foods as tolerated. Avoid greasy, spicy, heavy foods. If nausea and/or vomiting occur, drink only clear liquids until the nausea and/or vomiting subsides. Call your physician if vomiting continues.  Special Instructions/Symptoms: Your throat may feel dry or sore from the anesthesia or the breathing tube placed in your throat during surgery. If this causes discomfort, gargle with warm salt water. The discomfort should disappear within 24 hours.  If you had a scopolamine patch placed behind your ear for the management of post- operative nausea and/or vomiting:  1. The medication in the patch is effective for 72 hours, after which it should be removed.  Wrap patch in a tissue and discard in the trash. Wash hands thoroughly with soap and water. 2. You may remove the patch earlier than 72 hours if you experience unpleasant side effects which may include dry mouth, dizziness or visual disturbances. 3. Avoid touching the patch. Wash your hands with soap and water after contact with the patch.  No ibuprofen, Advil, Aleve, Motrin, or naproxen until after 9:45 pm today if needed.

## 2019-11-25 NOTE — H&P (Signed)
Joanna Massey is a 39 y.o. 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 She is a kind referral by Dr. Hoover Browns in [place], [place]. The patient describes regular periods every 28 days with significant dysmenorrhea. As part of an infertility workup she had an HSG on August 26, 2019 that showed a normal uterine cavity with non-opacification of the right fallopian tube, with a normal-appearing patent left tube. Her mid luteal phase progesterone was normal at 10.6 ng/mL. Her day 3 FSH/estradiol was 5.8 mIU/mL and 29 pg/mL respectively. Her AMH was normal at 2.9 ng/mL. She has a history of chlamydia treated years ago which was diagnosed during a screening visit. She had no symptoms of PID at the time.  Pertinent Gynecological History: Menses: flow is excessive with use of 3 pads or tampons on heaviest days Bleeding: dysfunctional uterine bleeding Contraception: none DES exposure: denies Blood transfusions: none Sexually transmitted diseases: no past history Last pap: normal    Menstrual History: Menarche age: 80 No LMP recorded.    Past Medical History:  Diagnosis Date  . GERD (gastroesophageal reflux disease)   . Headache                     Past Surgical History:  Procedure Laterality Date  . CHOLECYSTECTOMY  2019  . DILATION AND CURETTAGE OF UTERUS  2009             History reviewed. No pertinent family history. No hereditary disease.  No cancer of breast, ovary, uterus. No cutaneous leiomyomatosis or renal cell carcinoma.  Social History   Socioeconomic History  . Marital status: Single    Spouse name: Not on file  . Number of children: Not on file  . Years of education: Not on file  . Highest education level: Not on file  Occupational History  . Not on file  Tobacco Use  . Smoking status: Former Smoker    Packs/day: 0.25    Years: 20.00    Pack years: 5.00    Types: Cigarettes    Quit date: 01/29/2017    Years  since quitting: 2.8  . Smokeless tobacco: Never Used  Vaping Use  . Vaping Use: Every day  . Substances: Nicotine, Flavoring  Substance and Sexual Activity  . Alcohol use: No  . Drug use: Yes    Types: Marijuana    Comment: marijuana daily use  . Sexual activity: Not on file  Other Topics Concern  . Not on file  Social History Narrative  . Not on file   Social Determinants of Health   Financial Resource Strain:   . Difficulty of Paying Living Expenses: Not on file  Food Insecurity:   . Worried About Programme researcher, broadcasting/film/video in the Last Year: Not on file  . Ran Out of Food in the Last Year: Not on file  Transportation Needs:   . Lack of Transportation (Medical): Not on file  . Lack of Transportation (Non-Medical): Not on file  Physical Activity:   . Days of Exercise per Week: Not on file  . Minutes of Exercise per Session: Not on file  Stress:   . Feeling of Stress : Not on file  Social Connections:   . Frequency of Communication with Friends and Family: Not on file  . Frequency of Social Gatherings with Friends and Family: Not on file  . Attends Religious Services: Not on file  . Active Member of Clubs or Organizations: Not  on file  . Attends Banker Meetings: Not on file  . Marital Status: Not on file  Intimate Partner Violence:   . Fear of Current or Ex-Partner: Not on file  . Emotionally Abused: Not on file  . Physically Abused: Not on file  . Sexually Abused: Not on file    No Known Allergies  No current facility-administered medications on file prior to encounter.   Current Outpatient Medications on File Prior to Encounter  Medication Sig Dispense Refill  . cetirizine (ZYRTEC) 10 MG tablet Take 10 mg by mouth daily.    Marland Kitchen gabapentin (NEURONTIN) 300 MG capsule Take 300 mg by mouth as needed.    . gabapentin (NEURONTIN) 600 MG tablet Take 600 mg by mouth at bedtime.    . naproxen sodium (ALEVE) 220 MG tablet Take 220 mg by mouth daily as needed.    .  pantoprazole (PROTONIX) 40 MG tablet Take 1 tablet by mouth daily.    . Prenatal Vit-Fe Fumarate-FA (PRENATAL MULTIVITAMIN) TABS tablet Take 1 tablet by mouth daily at 12 noon.    Marland Kitchen UNABLE TO FIND Med Name: hycosamine 0.125 mg prn cramping    . UNABLE TO FIND Med Name: transexemic acid prn bleeding       Review of Systems  Constitutional: Negative.   HENT: Negative.   Eyes: Negative.   Respiratory: Negative.   Cardiovascular: Negative.   Gastrointestinal: Negative.   Genitourinary: Negative.   Musculoskeletal: Negative.   Skin: Negative.   Neurological: Negative.   Endo/Heme/Allergies: Negative.   Psychiatric/Behavioral: Negative.      Physical Exam  BP (!) 139/102   Pulse 89   Temp 98.6 F (37 C) (Oral)   Resp 18   Ht 5\' 3"  (1.6 m)   Wt 110 kg   LMP 11/06/2019   SpO2 99%   BMI 42.97 kg/m  Constitutional: She is oriented to person, place, and time. She appears well-developed and well-nourished.  HENT:  Head: Normocephalic and atraumatic.  Nose: Nose normal.  Mouth/Throat: Oropharynx is clear and moist. No oropharyngeal exudate.  Eyes: Conjunctivae normal and EOM are normal. Pupils are equal, round, and reactive to light. No scleral icterus.  Neck: Normal range of motion. Neck supple. No tracheal deviation present. No thyromegaly present.  Cardiovascular: Normal rate.   Respiratory: Effort normal and breath sounds normal.  GI: Soft. Bowel sounds are normal. She exhibits no distension and no mass. There is no tenderness.  Lymphadenopathy:    She has no cervical adenopathy.  Neurological: She is alert and oriented to person, place, and time. She has normal reflexes.  Skin: Skin is warm.  Psychiatric: She has a normal mood and affect. Her behavior is normal. Judgment and thought content normal.    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  01/06/2020  Marland Kitchen, MD

## 2019-11-26 ENCOUNTER — Encounter (HOSPITAL_BASED_OUTPATIENT_CLINIC_OR_DEPARTMENT_OTHER): Payer: Self-pay | Admitting: Obstetrics and Gynecology

## 2019-11-26 LAB — SURGICAL PATHOLOGY

## 2020-12-02 ENCOUNTER — Other Ambulatory Visit: Payer: Self-pay

## 2020-12-02 ENCOUNTER — Emergency Department (HOSPITAL_COMMUNITY): Payer: BC Managed Care – PPO

## 2020-12-02 ENCOUNTER — Emergency Department (HOSPITAL_COMMUNITY)
Admission: EM | Admit: 2020-12-02 | Discharge: 2020-12-02 | Disposition: A | Discharge status: Home / Self Care | Payer: BC Managed Care – PPO | Attending: Emergency Medicine | Admitting: Emergency Medicine

## 2020-12-02 ENCOUNTER — Encounter (HOSPITAL_COMMUNITY): Payer: Self-pay | Admitting: Emergency Medicine

## 2020-12-02 DIAGNOSIS — M545 Low back pain, unspecified: Secondary | ICD-10-CM | POA: Insufficient documentation

## 2020-12-02 DIAGNOSIS — G971 Other reaction to spinal and lumbar puncture: Secondary | ICD-10-CM

## 2020-12-02 DIAGNOSIS — Z87891 Personal history of nicotine dependence: Secondary | ICD-10-CM | POA: Diagnosis not present

## 2020-12-02 DIAGNOSIS — R519 Headache, unspecified: Secondary | ICD-10-CM | POA: Insufficient documentation

## 2020-12-02 HISTORY — PX: IR FLUORO GUIDED NEEDLE PLC ASPIRATION/INJECTION LOC: IMG2395

## 2020-12-02 MED ORDER — DIAZEPAM 5 MG PO TABS
10.0000 mg | ORAL_TABLET | Freq: Once | ORAL | Status: AC
  Administered 2020-12-02: 10 mg via ORAL
  Filled 2020-12-02: qty 2

## 2020-12-02 MED ORDER — MORPHINE SULFATE (PF) 4 MG/ML IV SOLN
8.0000 mg | Freq: Once | INTRAVENOUS | Status: AC
  Administered 2020-12-02: 8 mg via INTRAMUSCULAR
  Filled 2020-12-02: qty 2

## 2020-12-02 MED ORDER — ONDANSETRON HCL 4 MG/2ML IJ SOLN: 4.0000 mg | Freq: Once | INTRAMUSCULAR | Status: AC

## 2020-12-02 MED ORDER — METOCLOPRAMIDE HCL 5 MG/ML IJ SOLN
10.0000 mg | Freq: Once | INTRAMUSCULAR | Status: AC
  Administered 2020-12-02: 10 mg via INTRAVENOUS
  Filled 2020-12-02: qty 2

## 2020-12-02 MED ORDER — SODIUM CHLORIDE 0.9 % IV SOLN: INTRAVENOUS | Status: DC

## 2020-12-02 MED ORDER — ONDANSETRON HCL 4 MG/2ML IJ SOLN
INTRAMUSCULAR | Status: AC
  Administered 2020-12-02: 4 mg via INTRAVENOUS
  Filled 2020-12-02: qty 2

## 2020-12-02 MED ORDER — HYDROMORPHONE HCL 1 MG/ML IJ SOLN
0.5000 mg | Freq: Once | INTRAMUSCULAR | Status: AC
  Administered 2020-12-02: 0.5 mg via INTRAVENOUS
  Filled 2020-12-02: qty 1

## 2020-12-02 MED ORDER — IOPAMIDOL (ISOVUE-M 200) INJECTION 41%: 20.0000 mL | Freq: Once | INTRAMUSCULAR | Status: DC

## 2020-12-02 MED ORDER — IOPAMIDOL (ISOVUE-M 200) INJECTION 41%
INTRAMUSCULAR | Status: AC
  Filled 2020-12-02: qty 10

## 2020-12-02 MED ORDER — LIDOCAINE HCL (PF) 1 % IJ SOLN
INTRAMUSCULAR | Status: AC
  Filled 2020-12-02: qty 30

## 2020-12-02 NOTE — ED Triage Notes (Incomplete)
Pt had a LP yesterday, states she laid flat for 8 hours yesterday. Reports L side arm and entire back pain, intermittent and severe. Reports a pounding HA as well.

## 2020-12-02 NOTE — ED Provider Notes (Signed)
[facility]-EMERGENCY DEPT Provider Note   CSN: [id number] Arrival date & time: 12/02/20  0626     History Chief Complaint  Patient presents with   Back Pain    Joanna Massey is a 40 y.o. female.  40 year old female who presents with persistent back pain after having an LP yesterday at [facility].  Concern for possible intracranial hypertension and patient had an LP performed which drained approximately 30 cc of fluid according to procedure note.  She did feel better afterwards.  Since that time she has noted some lower back pain as well as persistent headache.  No fever or neck pain.  No peripheral weakness noted.  Has used ibuprofen without relief.      Past Medical History:  Diagnosis Date   GERD (gastroesophageal reflux disease)    Headache     There are no problems to display for this patient.   Past Surgical History:  Procedure Laterality Date   CHOLECYSTECTOMY  2019   DILATION AND CURETTAGE OF UTERUS  2009   HYSTEROSCOPY Right 11/25/2019   Procedure: HYSTEROSCOPY RIGHT TUBAL RECANNULAZATION INTRAOPERATIVE HSG WITH FTC CATHER SET, POLYPECTOMY;  Surgeon: Fermin Schwab, MD;  Location: [facility];  Service: Gynecology;  Laterality: Right;     OB History   No obstetric history on file.     No family history on file.  Social History   Tobacco Use   Smoking status: Former    Packs/day: 0.25    Years: 20.00    Pack years: 5.00    Types: Cigarettes    Quit date: 01/29/2017    Years since quitting: 3.8   Smokeless tobacco: Never  Vaping Use   Vaping Use: Every day   Substances: Nicotine, Flavoring  Substance Use Topics   Alcohol use: No   Drug use: Yes    Types: Marijuana    Comment: marijuana daily use    Home Medications Prior to Admission medications   Medication Sig Start Date End Date Taking? Authorizing Provider  cetirizine (ZYRTEC) 10 MG tablet Take 10 mg by mouth daily.    [provider]   gabapentin (NEURONTIN) 300 MG capsule Take 300 mg by mouth as needed.    [provider]  gabapentin (NEURONTIN) 600 MG tablet Take 600 mg by mouth at bedtime.    [provider]  naproxen sodium (ALEVE) 220 MG tablet Take 220 mg by mouth daily as needed.    [provider]  pantoprazole (PROTONIX) 40 MG tablet Take 1 tablet by mouth daily. 04/29/17   [provider]  Prenatal Vit-Fe Fumarate-FA (PRENATAL MULTIVITAMIN) TABS tablet Take 1 tablet by mouth daily at 12 noon.    [provider]  UNABLE TO FIND Med Name: hycosamine 0.125 mg prn cramping    [provider]  UNABLE TO FIND Med Name: transexemic acid prn bleeding    [provider]    Allergies    Patient has no known allergies.  Review of Systems   Review of Systems  All other systems reviewed and are negative.  Physical Exam Updated Vital Signs BP (!) 186/115 (BP Location: Right Arm)   Pulse 89   Temp 98.2 F (36.8 C) (Oral)   Resp 16   LMP 11/17/2020   SpO2 99%   Physical Exam Vitals and nursing note reviewed.  Constitutional:      General: She is not in acute distress.    Appearance: Normal appearance. She is well-developed. She is  not toxic-appearing.  HENT:     Head: Normocephalic and atraumatic.  Eyes:     General: Lids are normal.     Conjunctiva/sclera: Conjunctivae normal.     Pupils: Pupils are equal, round, and reactive to light.  Neck:     Thyroid: No thyroid mass.     Trachea: No tracheal deviation.  Cardiovascular:     Rate and Rhythm: Normal rate and regular rhythm.     Heart sounds: Normal heart sounds. No murmur heard.   No gallop.  Pulmonary:     Effort: Pulmonary effort is normal. No respiratory distress.     Breath sounds: Normal breath sounds. No stridor. No decreased breath sounds, wheezing, rhonchi or rales.  Abdominal:     General: There is no distension.     Palpations: Abdomen is soft.     Tenderness: There is no  abdominal tenderness. There is no rebound.  Musculoskeletal:        General: No tenderness. Normal range of motion.     Cervical back: Normal range of motion and neck supple.  Skin:    General: Skin is warm and dry.     Findings: No abrasion or rash.  Neurological:     Mental Status: She is alert and oriented to person, place, and time. Mental status is at baseline.     GCS: GCS eye subscore is 4. GCS verbal subscore is 5. GCS motor subscore is 6.     Cranial Nerves: No cranial nerve deficit.     Sensory: No sensory deficit.     Motor: Motor function is intact.  Psychiatric:        Attention and Perception: Attention normal.        Speech: Speech normal.        Behavior: Behavior normal.    ED Results / Procedures / Treatments   Labs (all labs ordered are listed, but only abnormal results are displayed) Labs Reviewed - No data to display  EKG None  Radiology No results found.  Procedures Procedures   Medications Ordered in ED Medications  morphine 4 MG/ML injection 8 mg (has no administration in time range)    ED Course  I have reviewed the triage vital signs and the nursing notes.  Pertinent labs & imaging results that were available during my care of the patient were reviewed by me and considered in my medical decision making (see chart for details).    MDM Rules/Calculators/A&P                           Patient medicated for pain here and feels better.  She had a IR guided blood patch done and feels better.  Will discharge home Final Clinical Impression(s) / ED Diagnoses Final diagnoses:  None    Rx / DC Orders ED Discharge Orders     None        Lorre Nick, MD 12/02/20 1641

## 2020-12-02 NOTE — ED Notes (Signed)
Patient transported to IR 

## 2020-12-02 NOTE — Progress Notes (Signed)
Patient ID: Joanna Massey, female   DOB: [birth year], 40 y.o.   MRN: [id number] Request received for blood patch on patient.  She is status post LP at [facility] yesterday for work-up of intracranial hypertension.  She now presents to [facility], ED with persistent headache and some back pain.  She has tried conservative measures without significant relief.  Details/risks of blood patch including but not limited to, internal bleeding, infection, injury to adjacent structures, inability to relieve headache discussed with patient with her understanding and consent.  Procedure scheduled for this afternoon.

## 2021-11-03 ENCOUNTER — Other Ambulatory Visit: Payer: Self-pay

## 2022-01-23 ENCOUNTER — Encounter (HOSPITAL_COMMUNITY): Payer: Self-pay | Admitting: *Deleted

## 2022-01-23 ENCOUNTER — Other Ambulatory Visit: Payer: Self-pay

## 2022-01-23 ENCOUNTER — Emergency Department (HOSPITAL_COMMUNITY)
Admission: EM | Admit: 2022-01-23 | Discharge: 2022-01-23 | Discharge status: Against Medical Advice | Payer: BC Managed Care – PPO | Attending: Emergency Medicine | Admitting: Emergency Medicine

## 2022-01-23 DIAGNOSIS — W260XXA Contact with knife, initial encounter: Secondary | ICD-10-CM | POA: Diagnosis not present

## 2022-01-23 DIAGNOSIS — S61412A Laceration without foreign body of left hand, initial encounter: Secondary | ICD-10-CM | POA: Diagnosis present

## 2022-01-23 DIAGNOSIS — Z5321 Procedure and treatment not carried out due to patient leaving prior to being seen by health care provider: Secondary | ICD-10-CM | POA: Insufficient documentation

## 2022-01-23 NOTE — ED Triage Notes (Signed)
Pt was opening a package with pocket knife, knife slipped cutting a laceration on her left hand, bleeding controled.  Needs updated Tetanus

## 2022-03-03 IMAGING — XA IR FLUORO GUIDE NDL PLMT / BX
2 series · 7 of 7 positions shown · non-contrast
Comparison: none

CLINICAL DATA: 40-year-old female who had a lumbar puncture at an
outside institution earlier this week. She has persistent positional
headache accompanied by nausea and vomiting concerning for spinal
headache. She presents from the emergency department for an epidural
blood patch.

[Series 1: ir fluoro guided needle plc aspiration/i · 4 of 50 frames shown (1 of 2)]
[frame 1/50]
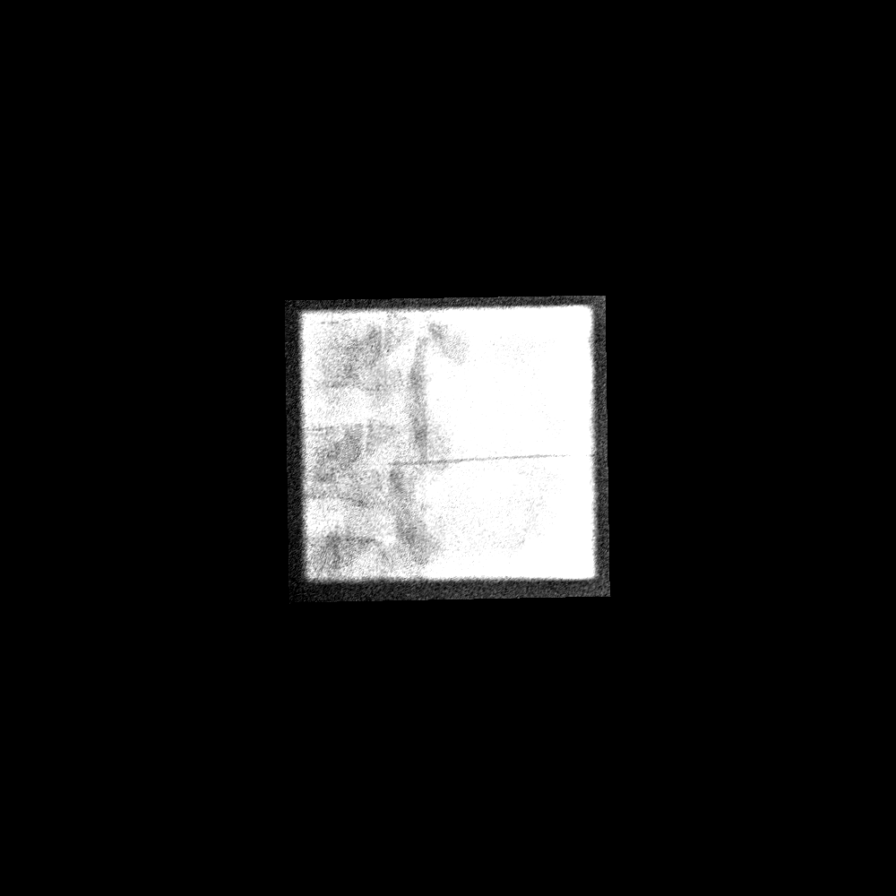
[frame 8/50]
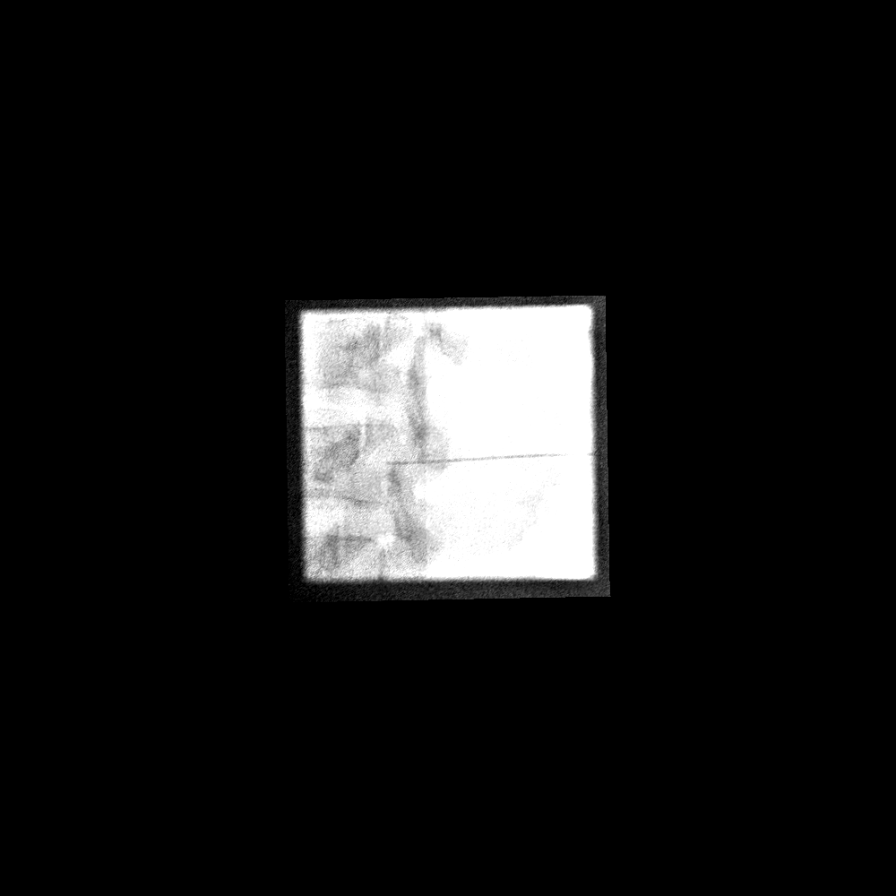
[frame 26/50]
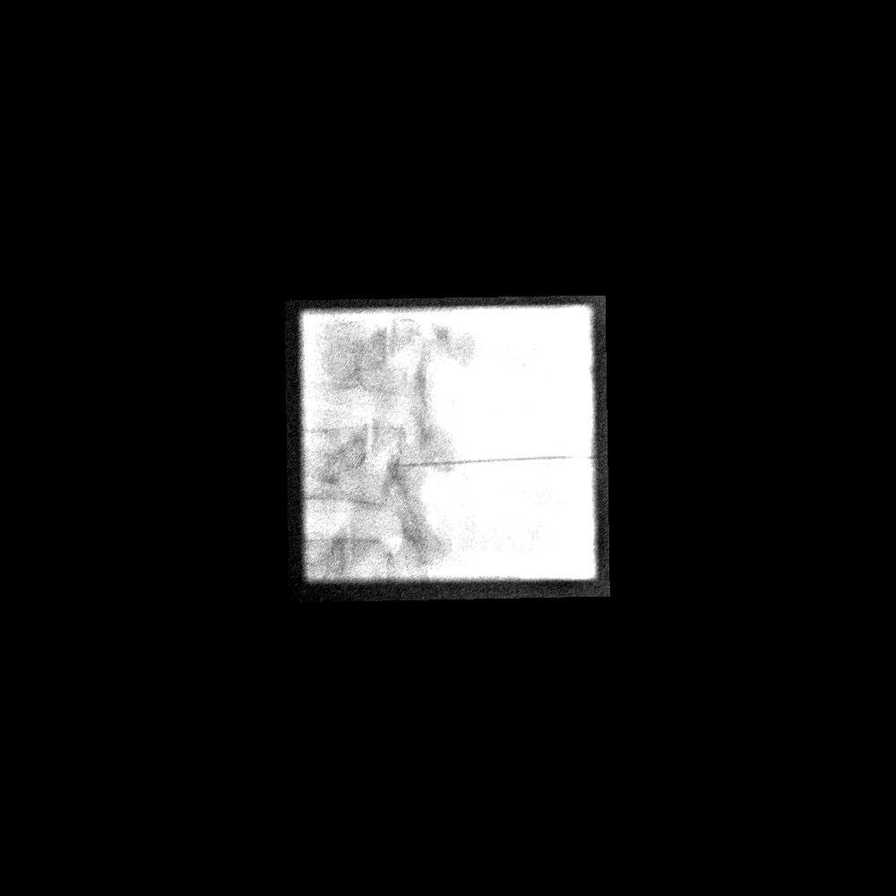
[frame 43/50]
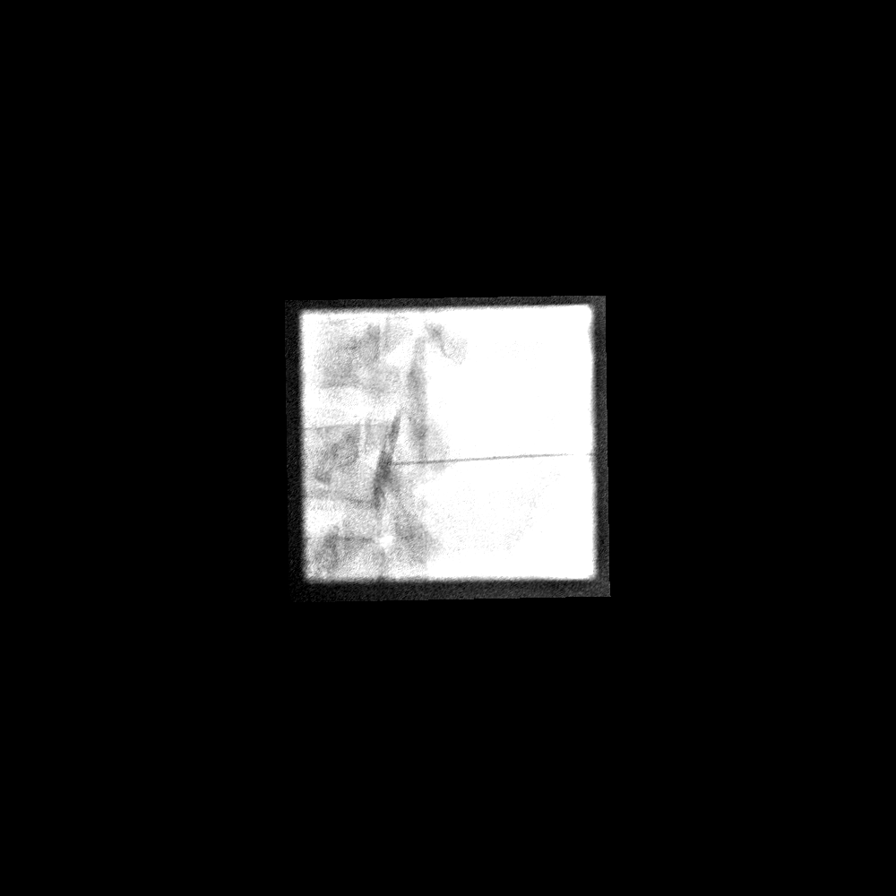

[Series 1: ir fluoro guided needle plc aspiration/i · 3 of 3 slices shown (2 of 2)]
[im 1/3]
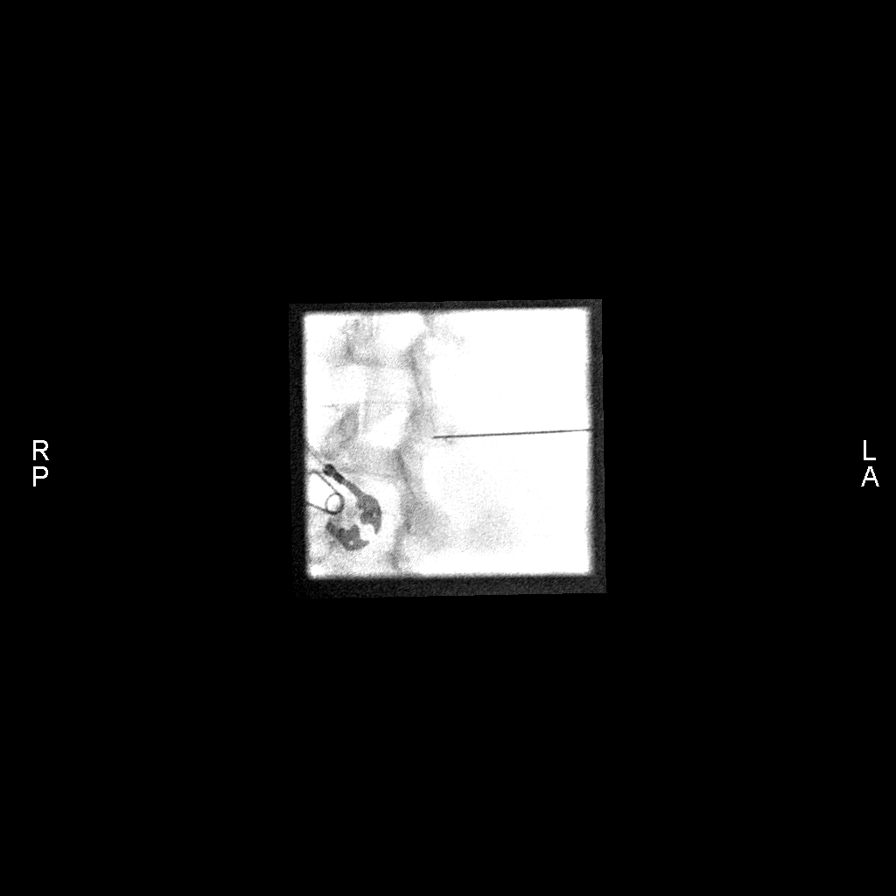
[im 2/3]
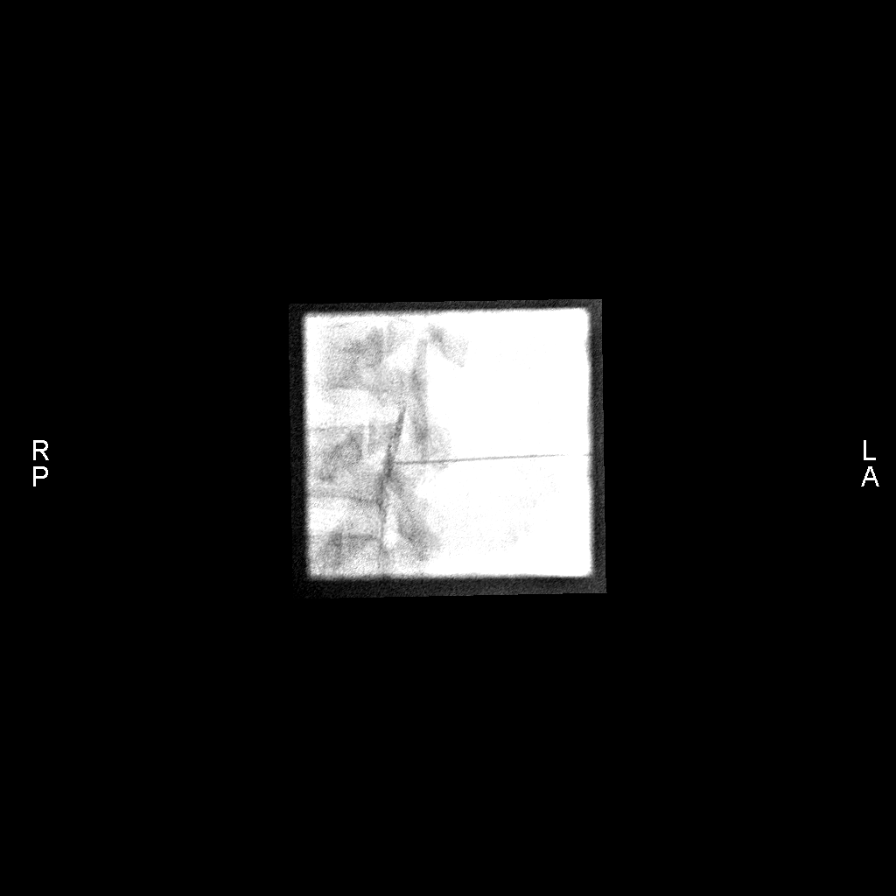
[im 3/3]
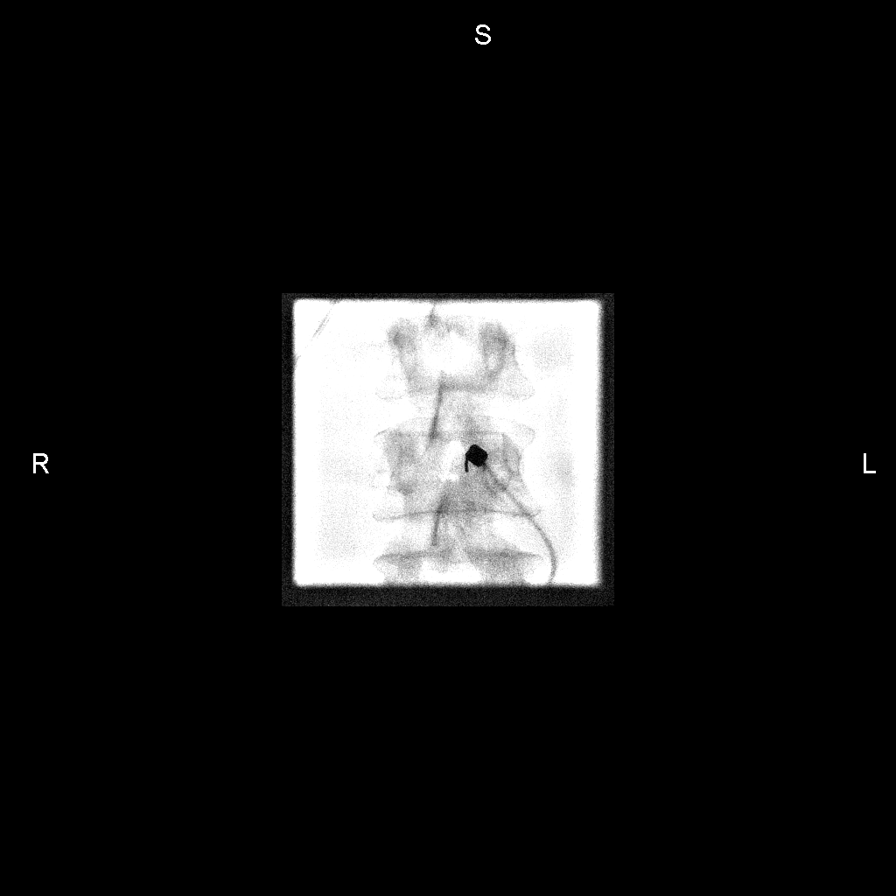

[7 of 7 positions shown; findings below may reference images not displayed]

EXAM:
IR FLUORO GUIDE NEEDLE PLACEMENT /BIOPSY

PROCEDURE:
The procedure, risks, benefits, and alternatives were explained to
the patient. Questions regarding the procedure were encouraged and
answered. The patient understands and consents to the procedure.

LUMBAR EPIDURAL BLOOD PATCH: 15 ml of blood were withdrawn from the
patient's antecubital fossa. An epidural approach was taken on the
right at L2-L3 using a 20 gauge epidural needle. Epidural
positioning was confirmed by injecting a small amount of Isovue-M
200. There was no vascular communication. 15 ml of the patient's
blood was slowly injected into the epidural space in this location.
The procedure was well-tolerated and she was discharged in good
condition with instructions to lie down for additional day.
IMPRESSION: Lumbar epidural blood patch on the right at L2-L3.

Patient reported near immediate decrease in headache symptoms.

## 2023-01-07 ENCOUNTER — Other Ambulatory Visit: Payer: Self-pay

## 2023-01-07 ENCOUNTER — Emergency Department (HOSPITAL_BASED_OUTPATIENT_CLINIC_OR_DEPARTMENT_OTHER)
Admission: EM | Admit: 2023-01-07 | Discharge: 2023-01-07 | Discharge status: Against Medical Advice | Payer: BC Managed Care – PPO | Attending: Emergency Medicine | Admitting: Emergency Medicine

## 2023-01-07 ENCOUNTER — Emergency Department (HOSPITAL_BASED_OUTPATIENT_CLINIC_OR_DEPARTMENT_OTHER): Payer: BC Managed Care – PPO

## 2023-01-07 ENCOUNTER — Encounter (HOSPITAL_BASED_OUTPATIENT_CLINIC_OR_DEPARTMENT_OTHER): Payer: Self-pay | Admitting: Emergency Medicine

## 2023-01-07 DIAGNOSIS — R079 Chest pain, unspecified: Secondary | ICD-10-CM | POA: Insufficient documentation

## 2023-01-07 DIAGNOSIS — Z5321 Procedure and treatment not carried out due to patient leaving prior to being seen by health care provider: Secondary | ICD-10-CM | POA: Diagnosis not present

## 2023-01-07 DIAGNOSIS — R112 Nausea with vomiting, unspecified: Secondary | ICD-10-CM | POA: Diagnosis not present

## 2023-01-07 DIAGNOSIS — R42 Dizziness and giddiness: Secondary | ICD-10-CM | POA: Diagnosis not present

## 2023-01-07 DIAGNOSIS — Z5329 Procedure and treatment not carried out because of patient's decision for other reasons: Secondary | ICD-10-CM

## 2023-01-07 LAB — PREGNANCY, URINE: Preg Test, Ur: NEGATIVE

## 2023-01-07 NOTE — ED Triage Notes (Signed)
Describes her pain as waves of tightness from left upper chest to left arm and left jaw , felt it x 2 in the past month , today's pain is worse with nausea and vomiting , and lightheadedness. Denies shortness of breath .  Had anxiety before but never been diagnosed or meds .

## 2023-01-07 NOTE — ED Provider Notes (Signed)
[place] [facility] AT [facility] Provider Note   CSN: [id number] Arrival date & time: 01/07/23  1233     History  Chief Complaint  Patient presents with   Chest Pain    Jeniyah Cauthorn is a 42 y.o. female.  Patient with history of GERD presents today with complaints of chest pain. She states that same has been occurring intermittently for the past 1 month. She states that she has had 3-4 episodes within the last month. States that the pain is in her left chest and radiates into her left jaw and down her left arm. States that the pain last anywhere from an hour to a full day and has no aggravating or relieving factors.  Specifically, it does not seem to get worse with exercise or relieved with rest.  States that yesterday she had the pain for most of the day which was concerning for her.  Says she woke up this morning and the pain was still there.  She tried to go to work today but then started feeling nauseous and vomited once and felt lightheaded.  The pain comes and goes in waves.  States that about 30 minutes prior to arrival today her pain resolved without intervention.  Denies ever having any shortness of breath.  Denies recent travel or recent surgeries.  No leg pain or leg swelling.  No history of blood clots or malignancy.  Denies any PND or orthopnea.  Denies any history of similar symptoms previously.  She does note that she is had a heart murmur that she was told about previously, she states she had an ultrasound to further evaluate this which was normal. She is currently asymptomatic.   The history is provided by the patient. No language interpreter was used.  Chest Pain      Home Medications Prior to Admission medications   Medication Sig Start Date End Date Taking? Authorizing Provider  gabapentin (NEURONTIN) 600 MG tablet Take 300-600 mg by mouth See admin instructions. Takes 300 mg during the day and 600 mg at night    [provider]   ibuprofen (ADVIL) 200 MG tablet Take 400 mg by mouth every 6 (six) hours as needed for mild pain.    [provider]  pantoprazole (PROTONIX) 40 MG tablet Take 40 mg by mouth daily. 04/29/17   [provider]      Allergies    Patient has no known allergies.    Review of Systems   Review of Systems  Cardiovascular:  Positive for chest pain.  All other systems reviewed and are negative.   Physical Exam Updated Vital Signs BP (!) 141/94   Pulse 78   Temp 98.6 F (37 C) (Oral)   Resp 18   Wt 95.3 kg   LMP 12/22/2022 (Approximate)   SpO2 100%   BMI 37.20 kg/m  Physical Exam Vitals and nursing note reviewed.  Constitutional:      General: She is not in acute distress.    Appearance: Normal appearance. She is normal weight. She is not ill-appearing, toxic-appearing or diaphoretic.  HENT:     Head: Normocephalic and atraumatic.  Cardiovascular:     Rate and Rhythm: Normal rate and regular rhythm.     Pulses:          Dorsalis pedis pulses are 2+ on the right side and 2+ on the left side.       Posterior tibial pulses are 2+ on the right side and 2+  on the left side.     Heart sounds: Normal heart sounds.  Pulmonary:     Effort: Pulmonary effort is normal. No respiratory distress.     Breath sounds: Normal breath sounds.  Chest:     Chest wall: No tenderness.  Abdominal:     Palpations: Abdomen is soft.     Tenderness: There is no abdominal tenderness.  Musculoskeletal:        General: Normal range of motion.     Cervical back: Normal range of motion.     Right lower leg: No tenderness. No edema.     Left lower leg: No tenderness. No edema.  Skin:    General: Skin is warm and dry.  Neurological:     General: No focal deficit present.     Mental Status: She is alert.  Psychiatric:        Mood and Affect: Mood normal.        Behavior: Behavior normal.     ED Results / Procedures / Treatments   Labs (all labs ordered are listed, but only abnormal  results are displayed) Labs Reviewed  PREGNANCY, URINE  BASIC METABOLIC PANEL  CBC  TROPONIN I (HIGH SENSITIVITY)  TROPONIN I (HIGH SENSITIVITY)    EKG EKG Interpretation Date/Time:  Monday January 07 2023 12:42:46 EST Ventricular Rate:  72 PR Interval:  153 QRS Duration:  105 QT Interval:  389 QTC Calculation: 426 R Axis:   53  Text Interpretation: Sinus rhythm Confirmed by Alvester Chou [id number]) on 01/07/2023 1:06:04 PM  Radiology DG Chest 2 View  Result Date: 01/07/2023 CLINICAL DATA:  Chest pain. EXAM: CHEST - 2 VIEW COMPARISON:  03/24/2016. FINDINGS: Low lung volume. Bilateral lung fields are clear. Bilateral costophrenic angles are clear. Normal cardio-mediastinal silhouette. No acute osseous abnormalities. The soft tissues are within normal limits. There are surgical clips in the right upper quadrant, typical of a previous cholecystectomy. IMPRESSION: No active cardiopulmonary disease. Electronically Signed   By: Jules Schick M.D.   On: 01/07/2023 15:32    Procedures Procedures    Medications Ordered in ED Medications - No data to display  ED Course/ Medical Decision Making/ A&P                                 Medical Decision Making Amount and/or Complexity of Data Reviewed Labs: ordered. Radiology: ordered.   This patient is a 42 y.o. female who presents to the ED for concern of chest pain, this involves an extensive number of treatment options, and is a complaint that carries with it a high risk of complications and morbidity. The emergent differential diagnosis prior to evaluation includes, but is not limited to,  ACS, pericarditis, myocarditis, aortic dissection, PE, pneumothorax, esophageal spasm or rupture, chronic angina, pneumonia, bronchitis, GERD, reflux/PUD, biliary disease, pancreatitis, costochondritis, anxiety    This is not an exhaustive differential.   Past Medical History / Co-morbidities / Social History:  has a past medical history of GERD  (gastroesophageal reflux disease) and Headache.  Additional history: Chart reviewed. Pertinent results include: patient states she has had an echocardiogram previously, I am unable to review these results or see any record of this in her chart  Physical Exam: Physical exam performed. The pertinent findings include: Lung sounds clear to auscultation in all fields, heart sounds without murmurs rubs or gallops.  No leg pain or leg swelling.  Abdomen soft and nontender.  Lab Tests: I ordered labs including troponin, CBC, BMP, Upreg. Unfortunately due to delays patient did not have labs ever drawn.   Imaging Studies: I ordered imaging studies including CXR. I independently visualized and interpreted imaging which showed NAD. I agree with the radiologist interpretation.   Cardiac Monitoring:  The patient was maintained on a cardiac monitor.  My attending physician Dr. Renaye Rakers viewed and interpreted the cardiac monitored which showed an underlying rhythm of: sinus rhythm. I agree with this interpretation.   Disposition:  14:45: Informed by nursing staff that patient is frustrated with wait times for getting her labs drawn. Discussed with patient that unfortunately staff was delayed in obtaining these labs. I had reached out to nursing staff to determine the etiology of the delay several times. During this conversation with the patient, I did offer to have her labs drawn by me or other staff members immediately and patient refused. Discussed that her chest x-ray and EKG were normal, however without lab work we cannot definitively rule out life threatening etiology of symptoms and therefore she would be leaving AGAINST MEDICAL ADVICE. We discussed the nature and purpose, risks and benefits, as well as, the alternatives of treatment. Time was given to allow the opportunity to ask questions and consider their options, and after the discussion, the patient decided to refuse the offerred treatment. The patient  was informed that refusal could lead to, but was not limited to, death, permanent disability, or severe pain. Prior to refusing, I determined that the patient had the capacity to make their decision and understood the consequences of that decision. After refusal, I made every reasonable opportunity to treat them to the best of my ability. Also given the nature of her pain with symptoms that radiate into her left jaw and down her left arm which are becoming more frequent and longer in duration, I did offer referral to cardiology for follow-up which patient also refused.  The patient was notified that they may return to the emergency department at any time for further treatment.  Patient then left prior to receiving paperwork  Findings and plan of care discussed with supervising physician Dr. Renaye Rakers who is in agreement.   Final Clinical Impression(s) / ED Diagnoses Final diagnoses:  Left against medical advice    Rx / DC Orders ED Discharge Orders     None         Vear Clock 01/07/23 1857    Terald Sleeper, MD 01/08/23 1756

## 2023-01-07 NOTE — ED Notes (Signed)
Pt states she  was ready to go and did not want labs drawn I spoke to Maralyn Sago and she went to speak to her  pt states she is leaving

## 2023-04-13 ENCOUNTER — Emergency Department (HOSPITAL_COMMUNITY)
Admission: EM | Admit: 2023-04-13 | Discharge: 2023-04-13 | Disposition: A | Discharge status: Home / Self Care | Attending: Emergency Medicine | Admitting: Emergency Medicine

## 2023-04-13 ENCOUNTER — Other Ambulatory Visit: Payer: Self-pay

## 2023-04-13 ENCOUNTER — Encounter (HOSPITAL_COMMUNITY): Payer: Self-pay

## 2023-04-13 DIAGNOSIS — R112 Nausea with vomiting, unspecified: Secondary | ICD-10-CM | POA: Diagnosis present

## 2023-04-13 DIAGNOSIS — R42 Dizziness and giddiness: Secondary | ICD-10-CM | POA: Diagnosis not present

## 2023-04-13 DIAGNOSIS — K219 Gastro-esophageal reflux disease without esophagitis: Secondary | ICD-10-CM | POA: Insufficient documentation

## 2023-04-13 DIAGNOSIS — R109 Unspecified abdominal pain: Secondary | ICD-10-CM | POA: Diagnosis not present

## 2023-04-13 DIAGNOSIS — R197 Diarrhea, unspecified: Secondary | ICD-10-CM | POA: Insufficient documentation

## 2023-04-13 LAB — COMPREHENSIVE METABOLIC PANEL
ALT: 12 U/L (ref 0–44)
AST: 28 U/L (ref 15–41)
Albumin: 4.5 g/dL (ref 3.5–5.0)
Alkaline Phosphatase: 63 U/L (ref 38–126)
Anion gap: 14 (ref 5–15)
BUN: 9 mg/dL (ref 6–20)
CO2: 20 mmol/L — ABNORMAL LOW (ref 22–32)
Calcium: 9.8 mg/dL (ref 8.9–10.3)
Chloride: 103 mmol/L (ref 98–111)
Creatinine, Ser: 0.99 mg/dL (ref 0.44–1.00)
GFR, Estimated: >60 mL/min (ref >60)
Glucose, Bld: 123 mg/dL — ABNORMAL HIGH (ref 70–99)
Potassium: 4.8 mmol/L (ref 3.5–5.1)
Sodium: 137 mmol/L (ref 135–145)
Total Bilirubin: 1.7 mg/dL — ABNORMAL HIGH (ref 0.0–1.2)
Total Protein: 8.4 g/dL — ABNORMAL HIGH (ref 6.5–8.1)

## 2023-04-13 LAB — CBC WITH DIFFERENTIAL/PLATELET
Abs Immature Granulocytes: 0.06 10*3/uL (ref 0.00–0.07)
Basophils Absolute: 0.1 10*3/uL (ref 0.0–0.1)
Basophils Relative: 0 %
Eosinophils Absolute: 0.1 10*3/uL (ref 0.0–0.5)
Eosinophils Relative: 1 %
HCT: 47.4 % — ABNORMAL HIGH (ref 36.0–46.0)
Hemoglobin: 16.2 g/dL — ABNORMAL HIGH (ref 12.0–15.0)
Immature Granulocytes: 0 %
Lymphocytes Relative: 17 %
Lymphs Abs: 2.3 10*3/uL (ref 0.7–4.0)
MCH: 32.4 pg (ref 26.0–34.0)
MCHC: 34.2 g/dL (ref 30.0–36.0)
MCV: 94.8 fL (ref 80.0–100.0)
Monocytes Absolute: 0.5 10*3/uL (ref 0.1–1.0)
Monocytes Relative: 3 %
Neutro Abs: 10.6 10*3/uL — ABNORMAL HIGH (ref 1.7–7.7)
Neutrophils Relative %: 79 %
Platelets: 294 10*3/uL (ref 150–400)
RBC: 5 MIL/uL (ref 3.87–5.11)
RDW: 12.2 % (ref 11.5–15.5)
WBC: 13.6 10*3/uL — ABNORMAL HIGH (ref 4.0–10.5)
nRBC: 0 % (ref 0.0–0.2)

## 2023-04-13 LAB — URINALYSIS, ROUTINE W REFLEX MICROSCOPIC
Bacteria, UA: NONE SEEN
Bilirubin Urine: NEGATIVE
Glucose, UA: NEGATIVE mg/dL
Hgb urine dipstick: NEGATIVE
Ketones, ur: 5 mg/dL — AB
Leukocytes,Ua: NEGATIVE
Nitrite: NEGATIVE
Protein, ur: 30 mg/dL — AB
Specific Gravity, Urine: 1.023 (ref 1.005–1.030)
pH: 5 (ref 5.0–8.0)

## 2023-04-13 LAB — HCG, QUANTITATIVE, PREGNANCY: hCG, Beta Chain, Quant, S: <1 m[IU]/mL (ref <5)

## 2023-04-13 LAB — LIPASE, BLOOD: Lipase: 31 U/L (ref 11–51)

## 2023-04-13 MED ORDER — ALUM & MAG HYDROXIDE-SIMETH 200-200-20 MG/5ML PO SUSP
30.0000 mL | Freq: Once | ORAL | Status: AC
  Administered 2023-04-13: 30 mL via ORAL
  Filled 2023-04-13: qty 30

## 2023-04-13 MED ORDER — LOPERAMIDE HCL 2 MG PO CAPS: 2.0000 mg | ORAL_CAPSULE | Freq: Four times a day (QID) | ORAL | 0 refills | Status: AC | PRN

## 2023-04-13 MED ORDER — FAMOTIDINE IN NACL 20-0.9 MG/50ML-% IV SOLN
20.0000 mg | Freq: Once | INTRAVENOUS | Status: AC
  Administered 2023-04-13: 20 mg via INTRAVENOUS
  Filled 2023-04-13: qty 50

## 2023-04-13 MED ORDER — ONDANSETRON 8 MG PO TBDP: 8.0000 mg | ORAL_TABLET | Freq: Three times a day (TID) | ORAL | 0 refills | Status: DC | PRN

## 2023-04-13 MED ORDER — ONDANSETRON 8 MG PO TBDP
8.0000 mg | ORAL_TABLET | Freq: Once | ORAL | Status: AC
  Administered 2023-04-13: 8 mg via ORAL
  Filled 2023-04-13: qty 1

## 2023-04-13 MED ORDER — HALOPERIDOL LACTATE 5 MG/ML IJ SOLN
5.0000 mg | Freq: Once | INTRAMUSCULAR | Status: AC
  Administered 2023-04-13: 5 mg via INTRAVENOUS
  Filled 2023-04-13: qty 1

## 2023-04-13 NOTE — ED Provider Notes (Signed)
 [place] EMERGENCY DEPARTMENT AT [facility] Provider Note   CSN: [id number] Arrival date & time: 04/13/23  4098     History  Chief Complaint  Patient presents with   Abdominal Pain   Emesis    Joanna Massey is a 43 y.o. female.  HPI    43 year old female comes in with chief complaint of nausea, vomiting and abdominal pain and diarrhea.  Patient reports that the symptoms started early in the morning around 4 AM.  She has had more than 5 episodes of emesis and diarrhea.  Emesis is bilious in nature.  Diarrhea primarily had bloody stools.  She is feeling weak, lightheaded.  Patient has previous history of cholecystectomy.  She has history of GERD.  She admits to marijuana use, but has not caused any problems in the past.  No other sick contacts.  Patient does not have any URI-like symptoms, fevers, chills.  Additionally, patient also indicates that she is on weight loss medication.  Dosing of that weight loss medication was increased recently.  Home Medications Prior to Admission medications   Medication Sig Start Date End Date Taking? Authorizing Provider  loperamide (IMODIUM) 2 MG capsule Take 1 capsule (2 mg total) by mouth 4 (four) times daily as needed for diarrhea or loose stools. 04/13/23  Yes Keaunna Skipper, MD  ondansetron (ZOFRAN-ODT) 8 MG disintegrating tablet Take 1 tablet (8 mg total) by mouth every 8 (eight) hours as needed for nausea. 04/13/23  Yes Derwood Kaplan, MD  gabapentin (NEURONTIN) 600 MG tablet Take 300-600 mg by mouth See admin instructions. Takes 300 mg during the day and 600 mg at night    [provider]  ibuprofen (ADVIL) 200 MG tablet Take 400 mg by mouth every 6 (six) hours as needed for mild pain.    [provider]  pantoprazole (PROTONIX) 40 MG tablet Take 40 mg by mouth daily. 04/29/17   [provider]      Allergies    Patient has no known allergies.    Review of Systems   Review of Systems  All  other systems reviewed and are negative.   Physical Exam Updated Vital Signs BP (!) 146/102   Pulse 82   Temp 97.8 F (36.6 C) (Oral)   Resp 18   Ht 5\' 3"  (1.6 m)   Wt 95.3 kg   SpO2 95%   BMI 37.22 kg/m  Physical Exam Vitals and nursing note reviewed.  Constitutional:      General: She is not in acute distress.    Appearance: She is well-developed. She is ill-appearing. She is not toxic-appearing or diaphoretic.  HENT:     Head: Normocephalic and atraumatic.  Eyes:     Extraocular Movements: Extraocular movements intact.  Cardiovascular:     Rate and Rhythm: Normal rate.  Pulmonary:     Effort: Pulmonary effort is normal.  Abdominal:     Palpations: Abdomen is soft.     Tenderness: There is no abdominal tenderness.  Musculoskeletal:     Cervical back: Normal range of motion and neck supple.  Neurological:     Mental Status: She is alert and oriented to person, place, and time.     ED Results / Procedures / Treatments   Labs (all labs ordered are listed, but only abnormal results are displayed) Labs Reviewed  COMPREHENSIVE METABOLIC PANEL - Abnormal; Notable for the following components:      Result Value   CO2 20 (*)    Glucose,  Bld 123 (*)    Total Protein 8.4 (*)    Total Bilirubin 1.7 (*)    All other components within normal limits  CBC WITH DIFFERENTIAL/PLATELET - Abnormal; Notable for the following components:   WBC 13.6 (*)    Hemoglobin 16.2 (*)    HCT 47.4 (*)    Neutro Abs 10.6 (*)    All other components within normal limits  URINALYSIS, ROUTINE W REFLEX MICROSCOPIC - Abnormal; Notable for the following components:   APPearance HAZY (*)    Ketones, ur 5 (*)    Protein, ur 30 (*)    All other components within normal limits  LIPASE, BLOOD  HCG, QUANTITATIVE, PREGNANCY    EKG EKG Interpretation Date/Time:  Saturday April 13 2023 08:25:50 EDT Ventricular Rate:  100 PR Interval:  149 QRS Duration:  98 QT Interval:  358 QTC  Calculation: 462 R Axis:   70  Text Interpretation: Sinus tachycardia Consider left atrial enlargement Low voltage, precordial leads No acute changes Confirmed by Derwood Kaplan ([id number]) on 04/13/2023 9:15:20 AM  Radiology No results found.  Procedures Procedures    Medications Ordered in ED Medications  ondansetron (ZOFRAN-ODT) disintegrating tablet 8 mg (has no administration in time range)  haloperidol lactate (HALDOL) injection 5 mg (5 mg Intravenous Given 04/13/23 0929)  famotidine (PEPCID) IVPB 20 mg premix (0 mg Intravenous Stopped 04/13/23 1031)  alum & mag hydroxide-simeth (MAALOX/MYLANTA) 200-200-20 MG/5ML suspension 30 mL (30 mLs Oral Given 04/13/23 3664)    ED Course/ Medical Decision Making/ A&P                                 Medical Decision Making Amount and/or Complexity of Data Reviewed Labs: ordered.  Risk OTC drugs. Prescription drug management.   This patient presents to the ED with chief complaint(s) of abdominal pain, nausea, vomiting and diarrhea with pertinent past medical history of cholecystectomy.The complaint involves an extensive differential diagnosis and also carries with it a high risk of complications and morbidity.    The differential diagnosis includes : Gastroenteritis, food toxin mediated process, dehydration, AKI, electrolyte abnormality, cyclic vomiting syndrome, SBO, ileus, IBS flareup, cannabinoid induced hyperemesis, spasmodic pain.  The initial plan is to get basic labs.  Patient does not appear profoundly dehydrated.  Abdomen is soft, no significant tenderness.  It does not appear clinically that patient has surgical abdomen.  Initial plan is to give her Haldol, as I think there is some component of spasmodic pain along with possibility of cannabinoid induced hyperemesis.  Symptoms were sudden, there is no infection like prodrome.  We will also hydrate the patient.   Additional history obtained: Additional history obtained from  significant other Records reviewed  care everywhere.  Patient has had IBS related visits.  GI note does indicate that patient could have some PUD, spasm of the sphincter of oddi and IBS.  She was prescribed Lomotil and some other medications.  Independent labs interpretation:  The following labs were independently interpreted: CBC, CMP is reassuring.  UA has mild ketones.  No high specific gravity at this time.   Treatment and Reassessment: Patient reassessed.  She indicates that after Haldol, her symptoms have improved.  She is not having vomiting, still feels nauseous.  I ordered p.o. challenge and Zofran.  However nursing staff came by indicating that patient now feels like she is comfortable and wants to leave, and wants her IV removed.  Final  Clinical Impression(s) / ED Diagnoses Final diagnoses:  Nausea vomiting and diarrhea    Rx / DC Orders ED Discharge Orders          Ordered    ondansetron (ZOFRAN-ODT) 8 MG disintegrating tablet  Every 8 hours PRN        04/13/23 1038    loperamide (IMODIUM) 2 MG capsule  4 times daily PRN        04/13/23 1039              Derwood Kaplan, MD 04/13/23 1040

## 2023-04-13 NOTE — Discharge Instructions (Addendum)
 You are seen in the ER for nausea, vomiting, diarrhea.  Take Zofran and loperamide for symptom management only. Ensure you are hydrating well.  Please return to the ER if your symptoms worsen; you have increased pain, fevers, chills, inability to keep any medications down, confusion.   As discussed, either discontinue or reduce your weight loss medication and call the doctor on Monday, to see if the dose increase of your weight loss medication is contributing.

## 2023-04-13 NOTE — ED Triage Notes (Signed)
 Pt reports with upper abdominal pain and vomiting since early this morning. Pt reports feeling light headed.

## 2023-04-13 NOTE — ED Notes (Signed)
 Vitals not obtained due to pt wanting to leave immediately.

## 2023-08-06 ENCOUNTER — Other Ambulatory Visit: Payer: Self-pay

## 2023-08-06 ENCOUNTER — Encounter (HOSPITAL_COMMUNITY): Payer: Self-pay | Admitting: *Deleted

## 2023-08-06 ENCOUNTER — Emergency Department (HOSPITAL_COMMUNITY): Admission: EM | Admit: 2023-08-06 | Discharge: 2023-08-06 | Disposition: A | Discharge status: Home / Self Care

## 2023-08-06 ENCOUNTER — Emergency Department (HOSPITAL_COMMUNITY)

## 2023-08-06 DIAGNOSIS — M25551 Pain in right hip: Secondary | ICD-10-CM | POA: Diagnosis not present

## 2023-08-06 DIAGNOSIS — M25511 Pain in right shoulder: Secondary | ICD-10-CM | POA: Diagnosis present

## 2023-08-06 DIAGNOSIS — W19XXXA Unspecified fall, initial encounter: Secondary | ICD-10-CM

## 2023-08-06 LAB — PREGNANCY, URINE: Preg Test, Ur: NEGATIVE

## 2023-08-06 MED ORDER — OXYCODONE-ACETAMINOPHEN 5-325 MG PO TABS
1.0000 | ORAL_TABLET | Freq: Once | ORAL | Status: AC
  Administered 2023-08-06: 1 via ORAL
  Filled 2023-08-06: qty 1

## 2023-08-06 MED ORDER — ONDANSETRON 4 MG PO TBDP
4.0000 mg | ORAL_TABLET | Freq: Once | ORAL | Status: AC
  Administered 2023-08-06: 4 mg via ORAL
  Filled 2023-08-06: qty 1

## 2023-08-06 MED ORDER — METHOCARBAMOL 500 MG PO TABS: 500.0000 mg | ORAL_TABLET | Freq: Two times a day (BID) | ORAL | 0 refills | Status: AC

## 2023-08-06 NOTE — Discharge Instructions (Signed)
 You may continue to wear the sling for comfort.  Please follow-up with your schedule appointment with your orthopedist tomorrow.  A short course of muscle relaxers has been prescribed to you, please take this as needed.

## 2023-08-06 NOTE — Progress Notes (Signed)
 Orthopedic Tech Progress Note Patient Details:  Joanna Massey 04-24-1980 [id number]  Ortho Devices Type of Ortho Device: Shoulder immobilizer Ortho Device/Splint Location: right Ortho Device/Splint Interventions: Ordered, Application, Adjustment   Post Interventions Patient Tolerated: Well Instructions Provided: Adjustment of device, Care of device  Waylan Thom Loving 08/06/2023, 10:59 AM
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
## 2023-10-01 ENCOUNTER — Emergency Department (HOSPITAL_BASED_OUTPATIENT_CLINIC_OR_DEPARTMENT_OTHER): Admitting: Radiology

## 2023-10-01 ENCOUNTER — Emergency Department (HOSPITAL_BASED_OUTPATIENT_CLINIC_OR_DEPARTMENT_OTHER)
Admission: EM | Admit: 2023-10-01 | Discharge: 2023-10-01 | Disposition: A | Discharge status: Home / Self Care | Attending: Emergency Medicine | Admitting: Emergency Medicine

## 2023-10-01 ENCOUNTER — Encounter (HOSPITAL_BASED_OUTPATIENT_CLINIC_OR_DEPARTMENT_OTHER): Payer: Self-pay

## 2023-10-01 ENCOUNTER — Other Ambulatory Visit: Payer: Self-pay

## 2023-10-01 DIAGNOSIS — R112 Nausea with vomiting, unspecified: Secondary | ICD-10-CM | POA: Insufficient documentation

## 2023-10-01 DIAGNOSIS — R079 Chest pain, unspecified: Secondary | ICD-10-CM | POA: Insufficient documentation

## 2023-10-01 LAB — BASIC METABOLIC PANEL WITH GFR
Anion gap: 12 (ref 5–15)
BUN: 10 mg/dL (ref 6–20)
CO2: 23 mmol/L (ref 22–32)
Calcium: 9.5 mg/dL (ref 8.9–10.3)
Chloride: 106 mmol/L (ref 98–111)
Creatinine, Ser: 0.89 mg/dL (ref 0.44–1.00)
GFR, Estimated: >60 mL/min (ref >60)
Glucose, Bld: 78 mg/dL (ref 70–99)
Potassium: 3.8 mmol/L (ref 3.5–5.1)
Sodium: 140 mmol/L (ref 135–145)

## 2023-10-01 LAB — CBC
HCT: 40.9 % (ref 36.0–46.0)
Hemoglobin: 14.1 g/dL (ref 12.0–15.0)
MCH: 34.3 pg — ABNORMAL HIGH (ref 26.0–34.0)
MCHC: 34.5 g/dL (ref 30.0–36.0)
MCV: 99.5 fL (ref 80.0–100.0)
Platelets: 247 K/uL (ref 150–400)
RBC: 4.11 MIL/uL (ref 3.87–5.11)
RDW: 12.2 % (ref 11.5–15.5)
WBC: 9.5 K/uL (ref 4.0–10.5)
nRBC: 0 % (ref 0.0–0.2)

## 2023-10-01 LAB — LIPASE, BLOOD: Lipase: 31 U/L (ref 11–51)

## 2023-10-01 LAB — TROPONIN T, HIGH SENSITIVITY
Troponin T High Sensitivity: <15 ng/L (ref 0–19)
Troponin T High Sensitivity: <15 ng/L (ref 0–19)

## 2023-10-01 MED ORDER — ONDANSETRON 4 MG PO TBDP: 4.0000 mg | ORAL_TABLET | Freq: Three times a day (TID) | ORAL | 0 refills | Status: DC | PRN

## 2023-10-01 MED ORDER — OMEPRAZOLE 20 MG PO CPDR
20.0000 mg | DELAYED_RELEASE_CAPSULE | Freq: Every day | ORAL | 0 refills | Status: AC
Start: 2023-10-01 — End: ?

## 2023-10-01 MED ORDER — ONDANSETRON HCL 4 MG/2ML IJ SOLN
4.0000 mg | Freq: Once | INTRAMUSCULAR | Status: AC
  Administered 2023-10-01: 4 mg via INTRAVENOUS
  Filled 2023-10-01: qty 2

## 2023-10-01 NOTE — ED Provider Notes (Signed)
 [place] EMERGENCY DEPARTMENT AT [facility] Provider Note   CSN: [id number] Arrival date & time: 10/01/23  9360     Patient presents with: Chest Pain   Joanna Massey is a 43 y.o. female.    Chest Pain Patient presents with chest pain.  Comes and goes.  Lasted around 5 minutes.  Will go away for 15 minutes and may come back.  Pressure goes to the back.  At times will also go to the left jaw and left arm.  Not exertional.  Not associate with eating.  Of note has had reported pinched nerve and recent had injections in her shoulder.  Has been taking Aleve.  Previous cholecystectomy.    Past Medical History:  Diagnosis Date   GERD (gastroesophageal reflux disease)    Headache    Past Surgical History:  Procedure Laterality Date   CHOLECYSTECTOMY  2019   DILATION AND CURETTAGE OF UTERUS  2009   HYSTEROSCOPY Right 11/25/2019   Procedure: HYSTEROSCOPY RIGHT TUBAL RECANNULAZATION INTRAOPERATIVE HSG WITH FTC CATHER SET, POLYPECTOMY;  Surgeon: Yalcinkaya, Tamer, MD;  Location: [facility];  Service: Gynecology;  Laterality: Right;   IR FLUORO GUIDED NEEDLE PLC ASPIRATION/INJECTION LOC  12/02/2020     Prior to Admission medications   Medication Sig Start Date End Date Taking? Authorizing Provider  omeprazole  (PRILOSEC) 20 MG capsule Take 1 capsule (20 mg total) by mouth daily. 10/01/23  Yes Patsey Lot, MD  ondansetron  (ZOFRAN -ODT) 4 MG disintegrating tablet Take 1 tablet (4 mg total) by mouth every 8 (eight) hours as needed. 10/01/23  Yes Patsey Lot, MD  gabapentin (NEURONTIN) 600 MG tablet Take 300-600 mg by mouth See admin instructions. Takes 300 mg during the day and 600 mg at night    [provider]  ibuprofen (ADVIL) 200 MG tablet Take 400 mg by mouth every 6 (six) hours as needed for mild pain.    [provider]  loperamide  (IMODIUM ) 2 MG capsule Take 1 capsule (2 mg total) by mouth 4 (four) times daily as needed for diarrhea  or loose stools. 04/13/23   Charlyn Sora, MD    Allergies: Patient has no known allergies.    Review of Systems  Cardiovascular:  Positive for chest pain.    Updated Vital Signs BP (!) 149/96 (BP Location: Left Arm)   Pulse 82   Temp 98.1 F (36.7 C) (Oral)   Resp 17   Ht 5' 3 (1.6 m)   Wt 84.8 kg   LMP 09/16/2023 (Approximate)   BMI 33.13 kg/m   Physical Exam Vitals reviewed.  Constitutional:      Appearance: She is well-developed.  Cardiovascular:     Rate and Rhythm: Normal rate and regular rhythm.  Pulmonary:     Breath sounds: No wheezing.  Chest:     Chest wall: No tenderness.  Abdominal:     Tenderness: There is no abdominal tenderness.  Musculoskeletal:     Right lower leg: No edema.     Left lower leg: No edema.  Skin:    General: Skin is warm.     Capillary Refill: Capillary refill takes less than 2 seconds.  Neurological:     Mental Status: She is alert.     (all labs ordered are listed, but only abnormal results are displayed) Labs Reviewed  CBC - Abnormal; Notable for the following components:      Result Value   MCH 34.3 (*)    All other components within normal limits  BASIC METABOLIC PANEL WITH GFR  LIPASE, BLOOD  PREGNANCY, URINE  TROPONIN T, HIGH SENSITIVITY  TROPONIN T, HIGH SENSITIVITY    EKG: EKG Interpretation Date/Time:  Tuesday October 01 2023 06:47:28 EDT Ventricular Rate:  78 PR Interval:  155 QRS Duration:  91 QT Interval:  374 QTC Calculation: 426 R Axis:   62  Text Interpretation: Sinus rhythm Probable anteroseptal infarct, old Nonspecific T abnormalities, lateral leads Confirmed by Patsey Lot [phone]) on 10/01/2023 6:54:35 AM  Radiology: [facility] Chest 2 View Result Date: 10/01/2023 EXAM: 2 VIEW(S) XRAY OF THE CHEST 10/01/2023 07:31:50 AM COMPARISON: 01/07/2023 CLINICAL HISTORY: Chest Pain. Triage note:; Patient from home with complaints of intermittent left chest pain that radiate to left neck and left upper back  that began this morning. Denies associated symptoms (refuses diaphoresis, N/V/D, or dizziness); A/ O x 4; skin warm/. Dry; spontaneous, even ; non-labored respirations FINDINGS: LUNGS AND PLEURA: No focal pulmonary opacity. No pulmonary edema. No pleural effusion. No pneumothorax. HEART AND MEDIASTINUM: No acute abnormality of the cardiac and mediastinal silhouettes. BONES AND SOFT TISSUES: No acute osseous abnormality. Cholecystectomy clips. IMPRESSION: 1. No acute findings. Electronically signed by: Katheleen Faes MD 10/01/2023 07:40 AM EDT RP Workstation: [id number]     Procedures   Medications Ordered in the ED  ondansetron  (ZOFRAN ) injection 4 mg (4 mg Intravenous Given 10/01/23 0837)                                    Medical Decision Making Amount and/or Complexity of Data Reviewed Labs: ordered. Radiology: ordered.  Risk Prescription drug management.  Patient with chest pain.  Goes to the jaw and the arm at times.  Not exertional.  No numbness weakness.  Not associate with eating.  Differential diagnosis along with and does include causes such as cardiac disease, musculoskeletal pain, pneumonia.  Will get x-ray.  Will get troponins.  Will also get lipase with radiation to the left shoulder area.  Workup reassuring.  Did develop some nausea and vomiting here which could be a cause.  Doubt cardiac ischemia.  Doubt pulm aneurysm.  Appears stable for discharge home.     Final diagnoses:  Chest pain, unspecified type    ED Discharge Orders          Ordered    ondansetron  (ZOFRAN -ODT) 4 MG disintegrating tablet  Every 8 hours PRN        10/01/23 1030    omeprazole  (PRILOSEC) 20 MG capsule  Daily        10/01/23 1030               Patsey Lot, MD 10/01/23 1515

## 2023-10-01 NOTE — ED Triage Notes (Signed)
 Patient from home with complaints of intermittent left chest pain that radiate to left neck and left upper back that began this morning. Denies associated symptoms (refuses diaphoresis, N/V/D, or dizziness); A/ O x 4; skin warm/. Dry; spontaneous, even non-labored respirations

## 2023-10-01 NOTE — Discharge Instructions (Signed)
 Follow-up with your doctor as needed.

## 2023-10-14 ENCOUNTER — Emergency Department (HOSPITAL_BASED_OUTPATIENT_CLINIC_OR_DEPARTMENT_OTHER)
Admission: EM | Admit: 2023-10-14 | Discharge: 2023-10-14 | Disposition: A | Discharge status: Home / Self Care | Attending: Emergency Medicine | Admitting: Emergency Medicine

## 2023-10-14 ENCOUNTER — Other Ambulatory Visit: Payer: Self-pay

## 2023-10-14 ENCOUNTER — Emergency Department (HOSPITAL_BASED_OUTPATIENT_CLINIC_OR_DEPARTMENT_OTHER)

## 2023-10-14 ENCOUNTER — Encounter (HOSPITAL_BASED_OUTPATIENT_CLINIC_OR_DEPARTMENT_OTHER): Payer: Self-pay

## 2023-10-14 DIAGNOSIS — R1013 Epigastric pain: Secondary | ICD-10-CM | POA: Insufficient documentation

## 2023-10-14 DIAGNOSIS — D72829 Elevated white blood cell count, unspecified: Secondary | ICD-10-CM | POA: Diagnosis not present

## 2023-10-14 DIAGNOSIS — R112 Nausea with vomiting, unspecified: Secondary | ICD-10-CM | POA: Insufficient documentation

## 2023-10-14 DIAGNOSIS — R1012 Left upper quadrant pain: Secondary | ICD-10-CM | POA: Insufficient documentation

## 2023-10-14 DIAGNOSIS — R1011 Right upper quadrant pain: Secondary | ICD-10-CM | POA: Diagnosis not present

## 2023-10-14 LAB — COMPREHENSIVE METABOLIC PANEL WITH GFR
ALT: 12 U/L (ref 0–44)
AST: 31 U/L (ref 15–41)
Albumin: 4.6 g/dL (ref 3.5–5.0)
Alkaline Phosphatase: 78 U/L (ref 38–126)
Anion gap: 19 — ABNORMAL HIGH (ref 5–15)
BUN: 11 mg/dL (ref 6–20)
CO2: 18 mmol/L — ABNORMAL LOW (ref 22–32)
Calcium: 10.4 mg/dL — ABNORMAL HIGH (ref 8.9–10.3)
Chloride: 105 mmol/L (ref 98–111)
Creatinine, Ser: 0.98 mg/dL (ref 0.44–1.00)
GFR, Estimated: >60 mL/min (ref >60)
Glucose, Bld: 146 mg/dL — ABNORMAL HIGH (ref 70–99)
Potassium: 4.4 mmol/L (ref 3.5–5.1)
Sodium: 142 mmol/L (ref 135–145)
Total Bilirubin: 0.5 mg/dL (ref 0.0–1.2)
Total Protein: 8.2 g/dL — ABNORMAL HIGH (ref 6.5–8.1)

## 2023-10-14 LAB — CBC
HCT: 41.9 % (ref 36.0–46.0)
Hemoglobin: 14.9 g/dL (ref 12.0–15.0)
MCH: 34.3 pg — ABNORMAL HIGH (ref 26.0–34.0)
MCHC: 35.6 g/dL (ref 30.0–36.0)
MCV: 96.3 fL (ref 80.0–100.0)
Platelets: 306 K/uL (ref 150–400)
RBC: 4.35 MIL/uL (ref 3.87–5.11)
RDW: 11.7 % (ref 11.5–15.5)
WBC: 22.3 K/uL — ABNORMAL HIGH (ref 4.0–10.5)
nRBC: 0 % (ref 0.0–0.2)

## 2023-10-14 LAB — URINALYSIS, ROUTINE W REFLEX MICROSCOPIC
Bacteria, UA: NONE SEEN
Bilirubin Urine: NEGATIVE
Glucose, UA: NEGATIVE mg/dL
Hgb urine dipstick: NEGATIVE
Ketones, ur: 40 mg/dL — AB
Leukocytes,Ua: NEGATIVE
Nitrite: NEGATIVE
Protein, ur: 30 mg/dL — AB
Specific Gravity, Urine: >1.046 — ABNORMAL HIGH (ref 1.005–1.030)
pH: 8.5 — ABNORMAL HIGH (ref 5.0–8.0)

## 2023-10-14 LAB — HCG, SERUM, QUALITATIVE: Preg, Serum: NEGATIVE

## 2023-10-14 LAB — LIPASE, BLOOD: Lipase: 18 U/L (ref 11–51)

## 2023-10-14 MED ORDER — METOCLOPRAMIDE HCL 5 MG/ML IJ SOLN
10.0000 mg | Freq: Once | INTRAMUSCULAR | Status: AC
  Administered 2023-10-14: 10 mg via INTRAVENOUS
  Filled 2023-10-14: qty 2

## 2023-10-14 MED ORDER — HYDROMORPHONE HCL 1 MG/ML IJ SOLN
0.5000 mg | Freq: Once | INTRAMUSCULAR | Status: AC
  Administered 2023-10-14: 0.5 mg via INTRAVENOUS
  Filled 2023-10-14: qty 1

## 2023-10-14 MED ORDER — LACTATED RINGERS IV BOLUS
1000.0000 mL | Freq: Once | INTRAVENOUS | Status: AC
  Administered 2023-10-14: 1000 mL via INTRAVENOUS

## 2023-10-14 MED ORDER — IOHEXOL 350 MG/ML SOLN
100.0000 mL | Freq: Once | INTRAVENOUS | Status: AC | PRN
  Administered 2023-10-14: 80 mL via INTRAVENOUS

## 2023-10-14 MED ORDER — ONDANSETRON 4 MG PO TBDP: 4.0000 mg | ORAL_TABLET | Freq: Three times a day (TID) | ORAL | 0 refills | Status: AC | PRN

## 2023-10-14 MED ORDER — ONDANSETRON HCL 4 MG/2ML IJ SOLN
4.0000 mg | Freq: Once | INTRAMUSCULAR | Status: AC | PRN
  Administered 2023-10-14: 4 mg via INTRAVENOUS
  Filled 2023-10-14: qty 2

## 2023-10-14 MED ORDER — METOCLOPRAMIDE HCL 10 MG PO TABS: 10.0000 mg | ORAL_TABLET | Freq: Three times a day (TID) | ORAL | 0 refills | Status: AC | PRN

## 2023-10-14 NOTE — ED Provider Notes (Signed)
 [place] EMERGENCY DEPARTMENT AT [facility] Provider Note   CSN: [id number] Arrival date & time: 10/14/23  1714     Patient presents with: Emesis   Joanna Massey is a 43 y.o. female.   HPI 43 year old female with a history of prior cholecystectomy and GERD presents with abdominal pain and vomiting.  Patient started having vomiting this morning and has had vomiting throughout the day.  Also having upper abdominal pain.  No chest pain, shortness of breath, fevers, diarrhea.  She is on a weight loss injectable medicine (Saxenda) and has not taken it for a couple days but did take it this morning prior to the symptoms.  It has caused nausea and vomiting before but never for this long.  Was given Zofran  in triage without relief.  Patient did notice a little bit of blood when she was vomiting when she got here.  States that it seemed like a relatively small amount after prolonged vomiting rather than an entire emesis of blood.  She feels like is because her throat is so raw.  Prior to Admission medications   Medication Sig Start Date End Date Taking? Authorizing Provider  metoCLOPramide  (REGLAN ) 10 MG tablet Take 1 tablet (10 mg total) by mouth every 8 (eight) hours as needed for nausea. 10/14/23  Yes Freddi Hamilton, MD  ondansetron  (ZOFRAN -ODT) 4 MG disintegrating tablet Take 1 tablet (4 mg total) by mouth every 8 (eight) hours as needed for nausea or vomiting. 10/14/23  Yes Freddi Hamilton, MD  gabapentin (NEURONTIN) 600 MG tablet Take 300-600 mg by mouth See admin instructions. Takes 300 mg during the day and 600 mg at night    [provider]  ibuprofen (ADVIL) 200 MG tablet Take 400 mg by mouth every 6 (six) hours as needed for mild pain.    [provider]  loperamide  (IMODIUM ) 2 MG capsule Take 1 capsule (2 mg total) by mouth 4 (four) times daily as needed for diarrhea or loose stools. 04/13/23   Charlyn Sora, MD  omeprazole  (PRILOSEC) 20 MG capsule Take 1  capsule (20 mg total) by mouth daily. 10/01/23   Patsey Lot, MD    Allergies: Patient has no known allergies.    Review of Systems  Constitutional:  Negative for fever.  Respiratory:  Negative for shortness of breath.   Cardiovascular:  Negative for chest pain.  Gastrointestinal:  Positive for abdominal pain, nausea and vomiting. Negative for diarrhea.  Genitourinary:  Negative for dysuria.    Updated Vital Signs BP (!) 142/81   Pulse 95   Temp 99.2 F (37.3 C)   Resp (!) 22   Ht 5' 3 (1.6 m)   Wt 84.8 kg   LMP 09/16/2023 (Approximate)   SpO2 100%   BMI 33.13 kg/m   Physical Exam Vitals and nursing note reviewed.  Constitutional:      General: She is not in acute distress.    Appearance: She is well-developed. She is not ill-appearing or diaphoretic.  HENT:     Head: Normocephalic and atraumatic.  Cardiovascular:     Rate and Rhythm: Regular rhythm. Tachycardia present.     Heart sounds: Normal heart sounds.     Comments: HR~100 Pulmonary:     Effort: Pulmonary effort is normal.     Breath sounds: Normal breath sounds.  Abdominal:     Palpations: Abdomen is soft.     Tenderness: There is abdominal tenderness in the right upper quadrant, epigastric area and left upper quadrant.  Skin:  General: Skin is warm and dry.  Neurological:     Mental Status: She is alert.     (all labs ordered are listed, but only abnormal results are displayed) Labs Reviewed  COMPREHENSIVE METABOLIC PANEL WITH GFR - Abnormal; Notable for the following components:      Result Value   CO2 18 (*)    Glucose, Bld 146 (*)    Calcium 10.4 (*)    Total Protein 8.2 (*)    Anion gap 19 (*)    All other components within normal limits  CBC - Abnormal; Notable for the following components:   WBC 22.3 (*)    MCH 34.3 (*)    All other components within normal limits  URINALYSIS, ROUTINE W REFLEX MICROSCOPIC - Abnormal; Notable for the following components:   Specific Gravity, Urine  >1.046 (*)    pH 8.5 (*)    Ketones, ur 40 (*)    Protein, ur 30 (*)    All other components within normal limits  LIPASE, BLOOD  HCG, SERUM, QUALITATIVE    EKG: EKG Interpretation Date/Time:  Monday October 14 2023 19:00:12 EDT Ventricular Rate:  87 PR Interval:  150 QRS Duration:  85 QT Interval:  388 QTC Calculation: 462 R Axis:   53  Text Interpretation: Sinus rhythm Probable left atrial enlargement Nonspecific T abnormalities, lateral leads similar to Sept 2 2025 Confirmed by Freddi Hamilton [phone]) on 10/14/2023 8:10:01 PM  Radiology: CT ABDOMEN PELVIS W CONTRAST Result Date: 10/14/2023 CLINICAL DATA:  Upper abdominal pain for several hours, initial encounter EXAM: CT ABDOMEN AND PELVIS WITH CONTRAST TECHNIQUE: Multidetector CT imaging of the abdomen and pelvis was performed using the standard protocol following bolus administration of intravenous contrast. RADIATION DOSE REDUCTION: This exam was performed according to the departmental dose-optimization program which includes automated exposure control, adjustment of the mA and/or kV according to patient size and/or use of iterative reconstruction technique. CONTRAST:  80mL OMNIPAQUE  IOHEXOL  350 MG/ML SOLN COMPARISON:  06/18/2017 FINDINGS: Lower chest: No acute abnormality. Hepatobiliary: No focal liver abnormality is seen. Status post cholecystectomy. No biliary dilatation. Pancreas: Unremarkable. No pancreatic ductal dilatation or surrounding inflammatory changes. Spleen: Normal in size without focal abnormality. Adrenals/Urinary Tract: Adrenal glands are within normal limits. Kidneys demonstrate a normal enhancement pattern bilaterally. Tiny 2 mm nonobstructing left renal stone is noted in the lower pole. A punctate 1 mm stone is noted in the left upper pole. No obstructive changes are seen. The bladder is decompressed. Stomach/Bowel: Colon is predominately decompressed. No inflammatory changes are seen. The appendix is within normal  limits. Small bowel and stomach are unremarkable. Vascular/Lymphatic: Aortic atherosclerosis. No enlarged abdominal or pelvic lymph nodes. Reproductive: Uterus is within normal limits. Ovaries are well visualized. A simple appearing 3.3 cm right ovarian cyst is noted. Other: No abdominal wall hernia or abnormality. No abdominopelvic ascites. Musculoskeletal: No acute or significant osseous findings. IMPRESSION: Nonobstructing left renal calculi as described. Right ovarian simple-appearing cyst measuring 3.3 cm. No follow-up imaging is recommended. Reference: JACR 2020 Feb;17(2):248-254 Electronically Signed   By: Oneil Devonshire M.D.   On: 10/14/2023 20:37     Procedures   Medications Ordered in the ED  ondansetron  (ZOFRAN ) injection 4 mg (4 mg Intravenous Given 10/14/23 1802)  lactated ringers  bolus 1,000 mL (0 mLs Intravenous Stopped 10/14/23 2117)  metoCLOPramide  (REGLAN ) injection 10 mg (10 mg Intravenous Given 10/14/23 1915)  HYDROmorphone  (DILAUDID ) injection 0.5 mg (0.5 mg Intravenous Given 10/14/23 1914)  iohexol  (OMNIPAQUE ) 350 MG/ML injection 100  mL (80 mLs Intravenous Contrast Given 10/14/23 2011)  metoCLOPramide  (REGLAN ) injection 10 mg (10 mg Intravenous Given 10/14/23 2113)  lactated ringers  bolus 1,000 mL (0 mLs Intravenous Stopped 10/14/23 2231)                                    Medical Decision Making Amount and/or Complexity of Data Reviewed Labs: ordered.    Details: WBC 22 Radiology: ordered and independent interpretation performed.    Details: No acute abdominal emergency ECG/medicine tests: ordered and independent interpretation performed.    Details: No ischemia  Risk Prescription drug management.   No clear cause for patient's vomiting, based on her prior history could be related to her acid Saxenda.  She is feeling a lot better after treatment, though did require multiple different antiemetics.  Has been given fluids as she does appear little acidotic from dehydration.   She does have an impressive leukocytosis but I suspect this is a stress response from such significant vomiting.  The small amount of blood she is describing sounds like a Mallory-Weiss tear.  At this point she is now requesting discharge and feels a lot better.  I do not think antibiotics are needed, doubt GI bleed or ulcer, and think she is a follow-up with her PCP.  Will recommend stopping the Saxenda for now and discussing with her PCP.  Will give return precautions.     Final diagnoses:  Nausea and vomiting, unspecified vomiting type    ED Discharge Orders          Ordered    ondansetron  (ZOFRAN -ODT) 4 MG disintegrating tablet  Every 8 hours PRN        10/14/23 2222    metoCLOPramide  (REGLAN ) 10 MG tablet  Every 8 hours PRN        10/14/23 2222               Freddi Hamilton, MD 10/14/23 2340

## 2023-10-14 NOTE — Discharge Instructions (Addendum)
 Your blood work is consistent with dehydration.  Your CT scan did not show any obvious cause for your vomiting.  They did see an ovarian cyst that can be followed up by her OB/GYN.  Otherwise, drink plenty of fluids and we are prescribing you antinausea medicine.  Stop the Saxenda and talk to your primary care provider.  If you develop new or worsening abdominal pain, vomiting or any other new/concerning symptoms then return to the ER.

## 2023-10-14 NOTE — ED Triage Notes (Signed)
 Pt POV reporting n/v and upper abd pain since this AM, unable to keep anything down.

## 2023-10-14 NOTE — ED Notes (Signed)
 Pt given discharge instructions. Opportunities given for questions. IV removed. Pt stable at time of discharge.
